# Patient Record
Sex: Female | Born: 1935 | Race: White | Hispanic: No | Marital: Single
Health system: Southern US, Community
[De-identification: ages and names within clinical notes are randomized; demographics above are authoritative.]

## PROBLEM LIST (undated history)

## (undated) DIAGNOSIS — E039 Hypothyroidism, unspecified: Secondary | ICD-10-CM

## (undated) DIAGNOSIS — G250 Essential tremor: Secondary | ICD-10-CM

## (undated) DIAGNOSIS — F3289 Other specified depressive episodes: Secondary | ICD-10-CM

## (undated) DIAGNOSIS — D649 Anemia, unspecified: Secondary | ICD-10-CM

## (undated) DIAGNOSIS — I10 Essential (primary) hypertension: Secondary | ICD-10-CM

## (undated) DIAGNOSIS — I2510 Atherosclerotic heart disease of native coronary artery without angina pectoris: Secondary | ICD-10-CM

## (undated) DIAGNOSIS — K589 Irritable bowel syndrome without diarrhea: Secondary | ICD-10-CM

## (undated) DIAGNOSIS — I509 Heart failure, unspecified: Secondary | ICD-10-CM

## (undated) DIAGNOSIS — F329 Major depressive disorder, single episode, unspecified: Secondary | ICD-10-CM

## (undated) DIAGNOSIS — R609 Edema, unspecified: Secondary | ICD-10-CM

## (undated) DIAGNOSIS — G252 Other specified forms of tremor: Secondary | ICD-10-CM

## (undated) HISTORY — DX: Other specified forms of tremor: G25.2

## (undated) HISTORY — DX: Hypothyroidism, unspecified: E03.9

## (undated) HISTORY — DX: Other specified depressive episodes: F32.89

## (undated) HISTORY — DX: Essential (primary) hypertension: I10

## (undated) HISTORY — DX: Edema, unspecified: R60.9

## (undated) HISTORY — DX: Essential tremor: G25.0

## (undated) HISTORY — DX: Heart failure, unspecified: I50.9

## (undated) HISTORY — DX: Anemia, unspecified: D64.9

## (undated) HISTORY — DX: Atherosclerotic heart disease of native coronary artery without angina pectoris: I25.10

## (undated) HISTORY — DX: Major depressive disorder, single episode, unspecified: F32.9

## (undated) HISTORY — DX: Irritable bowel syndrome, unspecified: K58.9

---

## 1997-08-16 ENCOUNTER — Other Ambulatory Visit: Admission: RE | Admit: 1997-08-16 | Discharge: 1997-08-16 | Payer: Self-pay | Admitting: Internal Medicine

## 1997-12-15 ENCOUNTER — Ambulatory Visit (HOSPITAL_COMMUNITY): Admission: RE | Admit: 1997-12-15 | Discharge: 1997-12-15 | Payer: Self-pay

## 1998-08-22 ENCOUNTER — Other Ambulatory Visit: Admission: RE | Admit: 1998-08-22 | Discharge: 1998-08-22 | Payer: Self-pay | Admitting: Internal Medicine

## 1999-03-12 ENCOUNTER — Encounter: Admission: RE | Admit: 1999-03-12 | Discharge: 1999-03-12 | Payer: Self-pay | Admitting: Internal Medicine

## 1999-03-12 ENCOUNTER — Encounter: Payer: Self-pay | Admitting: Internal Medicine

## 1999-05-09 ENCOUNTER — Ambulatory Visit (HOSPITAL_COMMUNITY): Admission: RE | Admit: 1999-05-09 | Discharge: 1999-05-09 | Payer: Self-pay | Admitting: *Deleted

## 1999-08-23 ENCOUNTER — Other Ambulatory Visit: Admission: RE | Admit: 1999-08-23 | Discharge: 1999-08-23 | Payer: Self-pay | Admitting: Internal Medicine

## 1999-10-06 ENCOUNTER — Emergency Department (HOSPITAL_COMMUNITY): Admission: EM | Admit: 1999-10-06 | Discharge: 1999-10-06 | Payer: Self-pay | Admitting: Emergency Medicine

## 1999-11-13 ENCOUNTER — Encounter: Admission: RE | Admit: 1999-11-13 | Discharge: 1999-11-13 | Payer: Self-pay | Admitting: Internal Medicine

## 1999-11-13 ENCOUNTER — Encounter: Payer: Self-pay | Admitting: Internal Medicine

## 2000-07-16 ENCOUNTER — Ambulatory Visit (HOSPITAL_COMMUNITY): Admission: RE | Admit: 2000-07-16 | Discharge: 2000-07-16 | Payer: Self-pay | Admitting: *Deleted

## 2000-09-09 ENCOUNTER — Other Ambulatory Visit: Admission: RE | Admit: 2000-09-09 | Discharge: 2000-09-09 | Payer: Self-pay | Admitting: Internal Medicine

## 2000-09-30 ENCOUNTER — Ambulatory Visit (HOSPITAL_COMMUNITY): Admission: RE | Admit: 2000-09-30 | Discharge: 2000-09-30 | Payer: Self-pay | Admitting: Cardiovascular Disease

## 2000-10-30 ENCOUNTER — Encounter: Admission: RE | Admit: 2000-10-30 | Discharge: 2000-10-30 | Payer: Self-pay | Admitting: Internal Medicine

## 2000-10-30 ENCOUNTER — Encounter: Payer: Self-pay | Admitting: Internal Medicine

## 2000-11-21 ENCOUNTER — Encounter: Payer: Self-pay | Admitting: Internal Medicine

## 2000-11-21 ENCOUNTER — Encounter: Admission: RE | Admit: 2000-11-21 | Discharge: 2000-11-21 | Payer: Self-pay | Admitting: Internal Medicine

## 2001-01-15 ENCOUNTER — Encounter: Payer: Self-pay | Admitting: Internal Medicine

## 2001-01-15 ENCOUNTER — Encounter: Admission: RE | Admit: 2001-01-15 | Discharge: 2001-01-15 | Payer: Self-pay | Admitting: Internal Medicine

## 2001-11-10 ENCOUNTER — Encounter: Payer: Self-pay | Admitting: Orthopedic Surgery

## 2001-11-10 ENCOUNTER — Encounter: Admission: RE | Admit: 2001-11-10 | Discharge: 2001-11-10 | Payer: Self-pay | Admitting: Orthopedic Surgery

## 2001-11-12 ENCOUNTER — Ambulatory Visit (HOSPITAL_BASED_OUTPATIENT_CLINIC_OR_DEPARTMENT_OTHER): Admission: RE | Admit: 2001-11-12 | Discharge: 2001-11-12 | Payer: Self-pay | Admitting: Orthopedic Surgery

## 2001-12-02 ENCOUNTER — Encounter: Admission: RE | Admit: 2001-12-02 | Discharge: 2001-12-02 | Payer: Self-pay | Admitting: Internal Medicine

## 2001-12-02 ENCOUNTER — Encounter: Payer: Self-pay | Admitting: Internal Medicine

## 2001-12-23 ENCOUNTER — Encounter: Payer: Self-pay | Admitting: Internal Medicine

## 2001-12-23 ENCOUNTER — Ambulatory Visit (HOSPITAL_COMMUNITY): Admission: RE | Admit: 2001-12-23 | Discharge: 2001-12-23 | Payer: Self-pay | Admitting: Internal Medicine

## 2002-01-14 ENCOUNTER — Encounter: Payer: Self-pay | Admitting: Emergency Medicine

## 2002-01-14 ENCOUNTER — Emergency Department (HOSPITAL_COMMUNITY): Admission: EM | Admit: 2002-01-14 | Discharge: 2002-01-14 | Payer: Self-pay | Admitting: Emergency Medicine

## 2002-02-01 ENCOUNTER — Ambulatory Visit (HOSPITAL_COMMUNITY): Admission: RE | Admit: 2002-02-01 | Discharge: 2002-02-01 | Payer: Self-pay | Admitting: Ophthalmology

## 2002-04-13 ENCOUNTER — Ambulatory Visit (HOSPITAL_COMMUNITY): Admission: RE | Admit: 2002-04-13 | Discharge: 2002-04-13 | Payer: Self-pay | Admitting: Gastroenterology

## 2002-09-03 ENCOUNTER — Ambulatory Visit (HOSPITAL_COMMUNITY): Admission: RE | Admit: 2002-09-03 | Discharge: 2002-09-04 | Payer: Self-pay | Admitting: General Surgery

## 2002-09-03 ENCOUNTER — Encounter: Payer: Self-pay | Admitting: General Surgery

## 2002-09-03 ENCOUNTER — Encounter (INDEPENDENT_AMBULATORY_CARE_PROVIDER_SITE_OTHER): Payer: Self-pay | Admitting: Specialist

## 2002-12-07 ENCOUNTER — Encounter: Admission: RE | Admit: 2002-12-07 | Discharge: 2002-12-07 | Payer: Self-pay | Admitting: Internal Medicine

## 2002-12-07 ENCOUNTER — Encounter: Payer: Self-pay | Admitting: Internal Medicine

## 2003-06-06 ENCOUNTER — Ambulatory Visit (HOSPITAL_COMMUNITY): Admission: RE | Admit: 2003-06-06 | Discharge: 2003-06-07 | Payer: Self-pay | Admitting: General Surgery

## 2003-12-08 ENCOUNTER — Encounter: Admission: RE | Admit: 2003-12-08 | Discharge: 2003-12-08 | Payer: Self-pay | Admitting: Internal Medicine

## 2004-04-26 ENCOUNTER — Ambulatory Visit (HOSPITAL_COMMUNITY): Admission: RE | Admit: 2004-04-26 | Discharge: 2004-04-26 | Payer: Self-pay | Admitting: Cardiology

## 2004-12-27 ENCOUNTER — Encounter: Admission: RE | Admit: 2004-12-27 | Discharge: 2004-12-27 | Payer: Self-pay | Admitting: Internal Medicine

## 2005-04-12 ENCOUNTER — Encounter: Admission: RE | Admit: 2005-04-12 | Discharge: 2005-04-12 | Payer: Self-pay | Admitting: Internal Medicine

## 2005-12-31 ENCOUNTER — Encounter: Admission: RE | Admit: 2005-12-31 | Discharge: 2005-12-31 | Payer: Self-pay | Admitting: *Deleted

## 2007-01-05 ENCOUNTER — Encounter: Admission: RE | Admit: 2007-01-05 | Discharge: 2007-01-05 | Payer: Self-pay | Admitting: *Deleted

## 2008-01-27 ENCOUNTER — Encounter: Admission: RE | Admit: 2008-01-27 | Discharge: 2008-01-27 | Payer: Self-pay | Admitting: *Deleted

## 2008-09-09 ENCOUNTER — Encounter: Admission: RE | Admit: 2008-09-09 | Discharge: 2008-09-09 | Payer: Self-pay | Admitting: Internal Medicine

## 2008-09-30 ENCOUNTER — Encounter: Payer: Self-pay | Admitting: Family Medicine

## 2008-09-30 ENCOUNTER — Ambulatory Visit (HOSPITAL_COMMUNITY): Admission: RE | Admit: 2008-09-30 | Discharge: 2008-09-30 | Payer: Self-pay | Admitting: Family Medicine

## 2008-09-30 ENCOUNTER — Ambulatory Visit: Payer: Self-pay | Admitting: *Deleted

## 2009-02-21 ENCOUNTER — Encounter: Admission: RE | Admit: 2009-02-21 | Discharge: 2009-02-21 | Payer: Self-pay | Admitting: Internal Medicine

## 2009-05-23 ENCOUNTER — Encounter: Admission: RE | Admit: 2009-05-23 | Discharge: 2009-05-23 | Payer: Self-pay | Admitting: Internal Medicine

## 2010-03-31 ENCOUNTER — Inpatient Hospital Stay (HOSPITAL_COMMUNITY)
Admission: EM | Admit: 2010-03-31 | Discharge: 2010-04-09 | Disposition: A | Discharge status: Other Acute Inpatient Hospital | Payer: Self-pay | Source: Home / Self Care | Attending: Cardiology | Admitting: Cardiology

## 2010-04-02 ENCOUNTER — Encounter (INDEPENDENT_AMBULATORY_CARE_PROVIDER_SITE_OTHER): Payer: Self-pay | Admitting: Cardiology

## 2010-06-18 LAB — BASIC METABOLIC PANEL
BUN: 24 mg/dL — ABNORMAL HIGH (ref 6–23)
CO2: 25 mEq/L (ref 19–32)
CO2: 32 mEq/L (ref 19–32)
CO2: 33 mEq/L — ABNORMAL HIGH (ref 19–32)
Calcium: 8.4 mg/dL (ref 8.4–10.5)
Calcium: 8.6 mg/dL (ref 8.4–10.5)
Calcium: 9.2 mg/dL (ref 8.4–10.5)
Chloride: 83 mEq/L — ABNORMAL LOW (ref 96–112)
Chloride: 87 mEq/L — ABNORMAL LOW (ref 96–112)
Creatinine, Ser: 0.76 mg/dL (ref 0.4–1.2)
Creatinine, Ser: 0.87 mg/dL (ref 0.4–1.2)
Creatinine, Ser: 1.05 mg/dL (ref 0.4–1.2)
GFR calc Af Amer: >60 mL/min (ref >60)
GFR calc Af Amer: >60 mL/min (ref >60)
GFR calc Af Amer: >60 mL/min (ref >60)
GFR calc Af Amer: >60 mL/min (ref >60)
GFR calc Af Amer: >60 mL/min (ref >60)
GFR calc non Af Amer: 51 mL/min — ABNORMAL LOW (ref >60)
GFR calc non Af Amer: >60 mL/min (ref >60)
GFR calc non Af Amer: >60 mL/min (ref >60)
GFR calc non Af Amer: >60 mL/min (ref >60)
Glucose, Bld: 106 mg/dL — ABNORMAL HIGH (ref 70–99)
Potassium: 4.6 mEq/L (ref 3.5–5.1)
Sodium: 127 mEq/L — ABNORMAL LOW (ref 135–145)
Sodium: 128 mEq/L — ABNORMAL LOW (ref 135–145)
Sodium: 128 mEq/L — ABNORMAL LOW (ref 135–145)
Sodium: 129 mEq/L — ABNORMAL LOW (ref 135–145)
Sodium: 133 mEq/L — ABNORMAL LOW (ref 135–145)

## 2010-06-18 LAB — COMPREHENSIVE METABOLIC PANEL
Albumin: 2.6 g/dL — ABNORMAL LOW (ref 3.5–5.2)
BUN: 19 mg/dL (ref 6–23)
BUN: 24 mg/dL — ABNORMAL HIGH (ref 6–23)
BUN: 28 mg/dL — ABNORMAL HIGH (ref 6–23)
CO2: 32 mEq/L (ref 19–32)
Calcium: 8.8 mg/dL (ref 8.4–10.5)
Calcium: 8.8 mg/dL (ref 8.4–10.5)
Chloride: 86 mEq/L — ABNORMAL LOW (ref 96–112)
Chloride: 90 mEq/L — ABNORMAL LOW (ref 96–112)
Creatinine, Ser: 0.79 mg/dL (ref 0.4–1.2)
Creatinine, Ser: 0.88 mg/dL (ref 0.4–1.2)
Creatinine, Ser: 1.19 mg/dL (ref 0.4–1.2)
GFR calc non Af Amer: 43 mL/min — ABNORMAL LOW (ref >60)
GFR calc non Af Amer: >60 mL/min (ref >60)
Glucose, Bld: 108 mg/dL — ABNORMAL HIGH (ref 70–99)
Glucose, Bld: 109 mg/dL — ABNORMAL HIGH (ref 70–99)
Total Bilirubin: 0.2 mg/dL — ABNORMAL LOW (ref 0.3–1.2)
Total Bilirubin: 0.5 mg/dL (ref 0.3–1.2)
Total Protein: 6.5 g/dL (ref 6.0–8.3)

## 2010-06-18 LAB — HEPARIN LEVEL (UNFRACTIONATED)
Heparin Unfractionated: 0.1 IU/mL — ABNORMAL LOW (ref 0.30–0.70)
Heparin Unfractionated: 0.26 IU/mL — ABNORMAL LOW (ref 0.30–0.70)
Heparin Unfractionated: 0.4 IU/mL (ref 0.30–0.70)
Heparin Unfractionated: <0.1 IU/mL — ABNORMAL LOW (ref 0.30–0.70)
Heparin Unfractionated: <0.1 IU/mL — ABNORMAL LOW (ref 0.30–0.70)
Heparin Unfractionated: <0.1 IU/mL — ABNORMAL LOW (ref 0.30–0.70)

## 2010-06-18 LAB — CBC
HCT: 27.2 % — ABNORMAL LOW (ref 36.0–46.0)
HCT: 27.3 % — ABNORMAL LOW (ref 36.0–46.0)
Hemoglobin: 8.9 g/dL — ABNORMAL LOW (ref 12.0–15.0)
Hemoglobin: 9.2 g/dL — ABNORMAL LOW (ref 12.0–15.0)
Hemoglobin: 9.2 g/dL — ABNORMAL LOW (ref 12.0–15.0)
Hemoglobin: 9.3 g/dL — ABNORMAL LOW (ref 12.0–15.0)
MCH: 29.7 pg (ref 26.0–34.0)
MCH: 29.8 pg (ref 26.0–34.0)
MCH: 29.8 pg (ref 26.0–34.0)
MCH: 30 pg (ref 26.0–34.0)
MCHC: 32.7 g/dL (ref 30.0–36.0)
MCHC: 33.2 g/dL (ref 30.0–36.0)
MCHC: 33.3 g/dL (ref 30.0–36.0)
MCHC: 33.5 g/dL (ref 30.0–36.0)
MCV: 88.9 fL (ref 78.0–100.0)
MCV: 89.2 fL (ref 78.0–100.0)
MCV: 89.8 fL (ref 78.0–100.0)
MCV: 90.3 fL (ref 78.0–100.0)
MCV: 93.1 fL (ref 78.0–100.0)
Platelets: 321 10*3/uL (ref 150–400)
Platelets: 398 10*3/uL (ref 150–400)
Platelets: 408 10*3/uL — ABNORMAL HIGH (ref 150–400)
RBC: 3.04 MIL/uL — ABNORMAL LOW (ref 3.87–5.11)
RBC: 3.05 MIL/uL — ABNORMAL LOW (ref 3.87–5.11)
RBC: 3.06 MIL/uL — ABNORMAL LOW (ref 3.87–5.11)
RBC: 3.1 MIL/uL — ABNORMAL LOW (ref 3.87–5.11)
RBC: 3.49 MIL/uL — ABNORMAL LOW (ref 3.87–5.11)
RDW: 16.4 % — ABNORMAL HIGH (ref 11.5–15.5)
RDW: 16.6 % — ABNORMAL HIGH (ref 11.5–15.5)
RDW: 16.7 % — ABNORMAL HIGH (ref 11.5–15.5)
WBC: 10.8 10*3/uL — ABNORMAL HIGH (ref 4.0–10.5)
WBC: 9.3 10*3/uL (ref 4.0–10.5)
WBC: 9.7 10*3/uL (ref 4.0–10.5)

## 2010-06-18 LAB — BRAIN NATRIURETIC PEPTIDE
Pro B Natriuretic peptide (BNP): 1009 pg/mL — ABNORMAL HIGH (ref 0.0–100.0)
Pro B Natriuretic peptide (BNP): 1065 pg/mL — ABNORMAL HIGH (ref 0.0–100.0)
Pro B Natriuretic peptide (BNP): 1288 pg/mL — ABNORMAL HIGH (ref 0.0–100.0)
Pro B Natriuretic peptide (BNP): 549 pg/mL — ABNORMAL HIGH (ref 0.0–100.0)

## 2010-06-18 LAB — APTT: aPTT: 39 seconds — ABNORMAL HIGH (ref 24–37)

## 2010-06-18 LAB — VITAMIN B12: Vitamin B-12: 1186 pg/mL — ABNORMAL HIGH (ref 211–911)

## 2010-06-18 LAB — BLOOD GAS, ARTERIAL
Acid-Base Excess: 1.9 mmol/L (ref 0.0–2.0)
Drawn by: 312881
FIO2: 0.4 %
O2 Content: 3 L/min
O2 Saturation: 97.7 %
Patient temperature: 98.7

## 2010-06-18 LAB — LIPID PANEL
Cholesterol: 132 mg/dL (ref 0–200)
LDL Cholesterol: 52 mg/dL (ref 0–99)
Total CHOL/HDL Ratio: 1.8 RATIO
Triglycerides: 37 mg/dL (ref <150)
VLDL: 7 mg/dL (ref 0–40)

## 2010-06-18 LAB — URINALYSIS, ROUTINE W REFLEX MICROSCOPIC
Bilirubin Urine: NEGATIVE
Glucose, UA: NEGATIVE mg/dL
Hgb urine dipstick: NEGATIVE
Ketones, ur: NEGATIVE mg/dL
Ketones, ur: NEGATIVE mg/dL
Leukocytes, UA: NEGATIVE
Nitrite: NEGATIVE
Specific Gravity, Urine: 1.01 (ref 1.005–1.030)
Specific Gravity, Urine: 1.013 (ref 1.005–1.030)
Urobilinogen, UA: 0.2 mg/dL (ref 0.0–1.0)

## 2010-06-18 LAB — URINE MICROSCOPIC-ADD ON

## 2010-06-18 LAB — CARDIAC PANEL(CRET KIN+CKTOT+MB+TROPI)
CK, MB: 3 ng/mL (ref 0.3–4.0)
CK, MB: 4.1 ng/mL — ABNORMAL HIGH (ref 0.3–4.0)
CK, MB: 6.6 ng/mL (ref 0.3–4.0)
CK, MB: 8.5 ng/mL (ref 0.3–4.0)
Relative Index: INVALID (ref 0.0–2.5)
Relative Index: INVALID (ref 0.0–2.5)
Total CK: 46 U/L (ref 7–177)
Total CK: 66 U/L (ref 7–177)
Troponin I: 0.46 ng/mL — ABNORMAL HIGH (ref 0.00–0.06)
Troponin I: 0.69 ng/mL (ref 0.00–0.06)

## 2010-06-18 LAB — POCT I-STAT, CHEM 8
Calcium, Ion: 1.03 mmol/L — ABNORMAL LOW (ref 1.12–1.32)
Glucose, Bld: 179 mg/dL — ABNORMAL HIGH (ref 70–99)
HCT: 35 % — ABNORMAL LOW (ref 36.0–46.0)
Hemoglobin: 11.9 g/dL — ABNORMAL LOW (ref 12.0–15.0)
Sodium: 130 mEq/L — ABNORMAL LOW (ref 135–145)

## 2010-06-18 LAB — HEPATIC FUNCTION PANEL
ALT: 70 U/L — ABNORMAL HIGH (ref 0–35)
Bilirubin, Direct: 0.1 mg/dL (ref 0.0–0.3)
Indirect Bilirubin: 0.5 mg/dL (ref 0.3–0.9)
Total Bilirubin: 0.6 mg/dL (ref 0.3–1.2)

## 2010-06-18 LAB — MAGNESIUM
Magnesium: 1.9 mg/dL (ref 1.5–2.5)
Magnesium: 1.9 mg/dL (ref 1.5–2.5)
Magnesium: 2.3 mg/dL (ref 1.5–2.5)

## 2010-06-18 LAB — URINE CULTURE: Culture  Setup Time: 201112250037

## 2010-06-18 LAB — POCT CARDIAC MARKERS
CKMB, poc: 14.6 ng/mL (ref 1.0–8.0)
Myoglobin, poc: 254 ng/mL (ref 12–200)
Troponin i, poc: 0.78 ng/mL (ref 0.00–0.09)

## 2010-06-18 LAB — DIFFERENTIAL
Lymphocytes Relative: 27 % (ref 12–46)
Lymphs Abs: 4.8 10*3/uL — ABNORMAL HIGH (ref 0.7–4.0)
Monocytes Relative: 7 % (ref 3–12)
Neutro Abs: 11.4 10*3/uL — ABNORMAL HIGH (ref 1.7–7.7)

## 2010-06-18 LAB — IRON AND TIBC
Saturation Ratios: 7 % — ABNORMAL LOW (ref 20–55)
TIBC: 246 ug/dL — ABNORMAL LOW (ref 250–470)

## 2010-06-18 LAB — TROPONIN I: Troponin I: 1.38 ng/mL (ref 0.00–0.06)

## 2010-06-18 LAB — CK TOTAL AND CKMB (NOT AT ARMC)
CK, MB: 16.6 ng/mL (ref 0.3–4.0)
Relative Index: 14.2 — ABNORMAL HIGH (ref 0.0–2.5)

## 2010-06-18 LAB — MRSA PCR SCREENING: MRSA by PCR: NEGATIVE

## 2010-07-19 ENCOUNTER — Encounter (INDEPENDENT_AMBULATORY_CARE_PROVIDER_SITE_OTHER): Payer: Medicare Other | Admitting: Cardiothoracic Surgery

## 2010-07-19 DIAGNOSIS — T85698A Other mechanical complication of other specified internal prosthetic devices, implants and grafts, initial encounter: Secondary | ICD-10-CM

## 2010-07-20 NOTE — Assessment & Plan Note (Signed)
OFFICE VISIT  [name], Tammy DOB:  [birth year]                                        July 19, 2010 CHART #:  [id number]  The patient comes to the office referred by [facility], Dr. Murray Hodgkins.  She had undergone coronary artery bypass grafting in 1999.  She is now 77 years old, has both psychologic and dementia issues and has been living at [name] who had noted a protruding sternal wire and referred her for evaluation.  She comes in today with transportation aide and her knees.  She is confused not sure where she is.  On exam, her blood pressure is 117/73, pulse 71, respiratory rate 16, and O2 saturations 97%.  Her sternum is stable and well healed.  The lower most sternal wire has a chronically appearing site where the end of the twisted wire is poking through the skin as it had gone from a laying down position on the bone to sticking directly out and had eroded through the skin.  In reviewing her chest x-ray, the wire appears intact.  The patient's healthcare power of attorney, niece, was present and we discussed options including hospital outpatient removal or removal in the office.  Neither the patient nor her niece were interested in having being coming to the hospital so she was agreeable with attempting to remove it in the office and if that was not possible then remove it in the in-hospital setting,  The skin around the protruding wire was cleaned with Betadine.  Lidocaine 1% was infiltrated.  The wire was untwisted, easily cut, and pulled out without discomfort to the patient.  A Steri-Strip was placed over the area. Local wound care with instructions were given.  The patient was returned to [name].  The niece notes that she will monitor.  If the patient has any wound issues, she will call office back immediately.  Sheliah Plane, MD Electronically Signed  EG/MEDQ  D:  07/19/2010  T:  07/20/2010  Job:  [id number]  cc:    Lenon Curt. Chilton Si, M.D.

## 2010-08-24 NOTE — H&P (Signed)
NAMEJESSEKA, [name]                         ACCOUNT NO.:  [id number]   MEDICAL RECORD NO.:  [id number]                   PATIENT TYPE:  OIB   LOCATION:  NA                                   FACILITY:  [facility]   PHYSICIAN:  Adolph Pollack, M.D.            DATE OF BIRTH:  [birth year]   DATE OF ADMISSION:  DATE OF DISCHARGE:                                HISTORY & PHYSICAL   REASON FOR ADMISSION:  Elective right inguinal hernia repair.   HISTORY OF PRESENT ILLNESS:  Ms. [name] is an 77 year old female who has  been having some discomfort in the right groin area.  I performed a  laparoscopic cholecystectomy on her in 2004, and laparoscopically saw what  appeared to be a direct inguinal hernia defect, which I informed her of.  It  started to become symptomatic, and now she presents for elective repair.   PAST MEDICAL HISTORY:  1. Hypertension.  2. Coronary artery disease.  3. Bell's palsy.  4. Irritable bowel syndrome.  5. Hiatal hernia with reflux.  6. Peptic ulcer disease.  7. Osteoarthritis.  8. Macular degeneration.   PREVIOUS OPERATIONS:  1. Coronary artery bypass grafting.  2. Bilateral cataract extraction.  3. Laparoscopic cholecystectomy.  4. Right knee arthroscopy.  5. Blood removed from the left eye.  6. Left ear surgery.  7. Tonsillectomy.   ALLERGIES:  1. ERYTHROMYCIN.  2. BACTRIM.  3. PHENERGAN.  4. BIAXIN.  5. ZOLOFT.   SOCIAL HISTORY:  There is no tobacco or alcohol use.   REVIEW OF SYSTEMS:  She lives at a friend's home.  Please the nursing  assessment for her full review of systems.   PHYSICAL EXAMINATION:  GENERAL:  An elderly female in no acute distress.  Very pleasant and cooperative.  NECK:  Supple, without palpable masses or thyroid enlargement.  CHEST:  Well-healed mid-sternal scar.  CARDIOVASCULAR:  Heart demonstrates a regular rate and rhythm.  RESPIRATORY:  Breath sounds equal and clear.  Respirations unlabored.  ABDOMEN:  Soft,  slightly protuberant.  Small upper abdominal scar was  present.  When she stands up, there is a bulge in the right inguinal area.   IMPRESSION:  Increasingly symptomatic right inguinal hernia.   PLAN:  Right inguinal hernia with mesh.  We went over the procedure and the  risks at length preoperatively.  The plan will be to keep her overnight in  the bed.                                                Adolph Pollack, M.D.    [name]  D:  06/06/2003  T:  06/06/2003  Job:  [id number]

## 2010-08-24 NOTE — Cardiovascular Report (Signed)
[place]  Patient:    [name], [name]                      MRN: [id number] Proc. Date: 09/30/00 Adm. Date:  [id number] Disc. Date: [id number] Attending:  Berry, Jonathan [place] CC:         Cardiac Catheterization Laboratory  Raymond C. Eloise Harman., M.D., [facility] R. Tysinger, M.D., [facility]   Cardiac Catheterization  INDICATIONS:  The patient is an 77 year old, white female, now eight years status post coronary artery bypass grafting x2 with a LIMA to her LAD with a graft to the diagonal branch.  She had a Cardiolite stress test performed recently that showed infralateral ischemia which was a new finding compared to her previous Cardiolite performed May 11, 1998.  She had several episodes of chest pain.  She presents now for diagnostic coronary arteriography.  DESCRIPTION OF PROCEDURE:  The patient was brought to the second floor [facility] in the postabsorptive state.  She was premedication with p.o. Valium.  Her right groin was prepped and shaved in the usual sterile fashion.  Xylocaine 1% was used for local anesthesia.  A 6 French sheath was inserted into the right femoral artery using standard Seldinger technique.  The 6 French right and left Judkins diagnostic catheter along with a 6 French pigtail catheter were used for selective coronary angiography, left ventriculography, selective vein graft angiography, internal mammary artery angiography and distal abdominal aortography.  Omnipaque dye was used for the entirety of the case.  Retrograde, aortic, left ventricular, and pullback pressures were recorded.  HEMODYNAMICS: 1. Aortic systolic pressure 179, diastolic pressure 78. 2. Left ventricular systolic pressure 179 and diastolic pressure 24.  SELECTIVE CORONARY ANGIOGRAPHY: 1. Left main:  Normal. 2. Left anterior descending:  The entire proximal LAD was heavily  calcified    fluoroscopically.  There were tandem 70, 90, and 99% stenoses with    trickle component of flow. 3. Left circumflex:  This vessel was free of significant disease. 4. Right coronary artery:  This is a large dominant vessel that was free of    significant disease. 5. Vein graft to the diagonal branch:  Widely patent with excellent retrograde    filling of the LAD and obvious competitive flow from the IMA. 6. Left internal mammary artery:  Widely patent filling the distal    LAD.  LEFT VENTRICULOGRAPHY:  The RAO left ventriculogram was performed using 25 cc of Omnipaque dye at 12 cc/sec.  The overall LVEF was estimated greater than 60% without focal wall motion abnormalities.  DISTAL ABDOMINAL AORTOGRAPHY:  Distal abdominal aortogram was performed using 20 cc of Omnipaque dye at 20 cc/sec.  The renal arteries are widely patent. The infrarenal abdominal aorta and iliac bifurcation appear free of significant atherosclerotic changes.  IMPRESSION:  The patient has widely patent grafts without any angiographic evidence of jeopardized myocardium.  I am unsure of the etiology of her chest pain and/or her Cardiolite abnormalities in the inferolateral distribution. Continued medical therapy will be recommended.  The sheaths were removed and pressure was held on the groin to achieve hemostasis.  The patient left the lab in stable condition.  She will be discharged home today as an outpatient and will follow up with Dr. Charolette Child.  The patient left the lab in stable condition. DD:  09/30/00 TD:  09/30/00 Job: [id number]

## 2010-08-24 NOTE — Procedures (Signed)
[facility]  Patient:    [name], [name]                      MRN: [id number] Proc. Date: 07/16/00 Adm. Date:  [id number] Attending:  Sabino Gasser                           Procedure Report  PROCEDURE PERFORMED:  Colonoscopy.  ENDOSCOPIST:  Sabino Gasser, M.D.  INDICATIONS FOR PROCEDURE:  Rectal bleeding.  ANESTHESIA:  Demerol 60 mg, Versed 6 mg.  DESCRIPTION OF PROCEDURE:  With the patient mildly sedated in the left lateral decubitus position, the Olympus videoscopic colonoscope was inserted in the rectum and passed under direct vision into the cecum through a tortuous sigmoid colon.  The cecum was identified by the ileocecal valve and appendiceal orifice, both of which were photographed.  From this point, the colonoscope was slowly withdrawn, taking circumferential views of the entire colonic mucosa stopping to photograph diverticula seen along the way in the sigmoid colon until we reached the rectum, which appeared normal on direct view and showed hemorrhoids on retroflex view.  The endoscope was straightened and withdrawn.  Patients vital signs and pulse oximeter remained stable.  The patient tolerated the procedure well and without apparent complications.  FINDINGS:  Diverticulosis of the sigmoid colon, internal hemorrhoids. Otherwise unremarkable examination.  PLAN:  Repeat examination in five years if appropriate. DD:  07/16/00 TD:  07/16/00 Job: [id number]
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
## 2010-08-24 NOTE — Op Note (Signed)
[name], [name]                         ACCOUNT NO.:  [id number]   MEDICAL RECORD NO.:  [id number]                   PATIENT TYPE:  OIB   LOCATION:  NA                                   FACILITY:  [facility]   PHYSICIAN:  Adolph Pollack, M.D.            DATE OF BIRTH:  29-Oct-1917   DATE OF PROCEDURE:  06/06/2003  DATE OF DISCHARGE:                                 OPERATIVE REPORT   PREOPERATIVE DIAGNOSIS:  Right inguinal hernia.   POSTOPERATIVE DIAGNOSIS:  Direct right inguinal hernia.   PROCEDURE:  Repair of direct inguinal hernia with polypropylene mesh.   ANESTHESIA:  Local (1% lidocaine with epinephrine plus 0.5% Marcaine plain  plus sodium bicarbonate) MAC.   INDICATIONS FOR PROCEDURE:  [name] is an 77 year old female with  discomfort, found to have an inguinal hernia which is becoming increasingly  symptomatic. She now presents for repair.   DESCRIPTION OF PROCEDURE:  She was seen in the holding area, was taken to  the operating room, placed supine on the operating table. The right groin  was marked with my initials. She was given IV sedation. The right groin was  sterilely prepped and draped.  Local anesthesia was infiltrated in the right  groin region, both superficially and deep. Then incision was made in the  right groin through the skin and subcutaneous tissue until external oblique  aponeurosis was encountered.  More local anesthetic was infiltrated deep to  this, and incision was made through the external oblique aponeurosis toward  the pubic tubercle medially, out toward the anterosuperior iliac spine  laterally.  I then used blunt dissection to identify the underlying internal oblique  aponeurosis and the shelving edge of the inguinal ligament.   There was obvious direct hernia with a fairly large defect present.  Some  extraperitoneal fat was in the hernia. This was reduced.  I identified the  round ligament, skeletonized it, then I ligated it both  medially and  laterally and excised it.  I subsequently loosely approximated some of the  attenuated transversalis fascia together with a running 2-0 Vicryl suture to  keep some of the extraperitoneal fat from coming into the field.  Next, I  brought a piece of 3 x 6 inch polypropylene mesh into the field and anchored  it 1 cm medial to the pubic tubercle with a 2-0 Prolene suture. The inferior  aspect of the mesh was then anchored to the shelving edge of the inguinal  ligament with a running 2-0 Prolene suture.  The superior aspect of the mesh  was then anchored to the internal oblique muscle and aponeurosis with  interrupted 2-0 Vicryl sutures. This provided for more than adequate  coverage of the defect.  The lateral aspect of the mesh was tucked deep to  the external oblique aponeurosis.  Hemostasis was adequate at this time.  I  then anesthetized the ilioinguinal nerve with local anesthetic.  I closed the external oblique aponeurosis over the mesh with a running 3-0  Vicryl suture. Scarpa's fascia was closed with a running 2-0 Vicryl suture.  Skin incision was closed with 4-0 Monocryl subcuticular stitch.  Steri-  Strips and sterile dressings were applied.  The patient tolerated the  procedure well without any apparent complications and was taken to the  recovery room in satisfactory condition.                                               Adolph Pollack, M.D.    Kari Baars  D:  06/06/2003  T:  06/06/2003  Job:  [id number]   cc:   Janae Bridgeman. Eloise Harman., M.D.  [address]  [place]  [place] [address]  Fax: [phone]   Aram Candela. Aleen Campi, M.D.  [address]  [place]  [place] [address]  Fax: [phone]

## 2010-08-24 NOTE — Op Note (Signed)
[name], [name]                         ACCOUNT NO.:  [id number]   MEDICAL RECORD NO.:  [id number]                   PATIENT TYPE:   LOCATION:                                       FACILITY:   PHYSICIAN:  Nadara Mustard, M.D.                DATE OF BIRTH:   DATE OF PROCEDURE:  11/12/2001  DATE OF DISCHARGE:                                 OPERATIVE REPORT   PREOPERATIVE DIAGNOSIS:  Internal derangement, right knee.   POSTOPERATIVE DIAGNOSES:  1. Osteochondral defect of the patella, trochlea, medial femoral condyle and     medial tibial plateau.  2. Bucket-handle lateral meniscal tear.   PROCEDURES:  1. Right knee arthroscopy.  2. Debridement of patella, trochlea, medial femoral condyle and medial     tibial plateau.  3. Partial lateral meniscectomy.   SURGEON:  Nadara Mustard, M.D.   ANESTHESIA:  Knee block.   ESTIMATED BLOOD LOSS:  Minimal.   TOURNIQUET TIME:  None.   DISPOSITION:  To PACU in stable condition.   INDICATIONS FOR PROCEDURE:  The patient is an 77 year old woman who has had  mechanical catching and locking symptoms in her right knee.  She has failed  conservative care.  She has undergone treatment with anti-inflammatories,  injections and activity modifications without relief and presented at this  time for arthroscopic intervention.  The risks and benefits were discussed  including infection, neurovascular injury, persistent pain and need for  additional surgery.  The patient's states she understands and wishes to  proceed at this time.   DESCRIPTION OF PROCEDURE:  The patient was brought to OR room 5 after  undergoing a knee block.  After adequate level of the anesthesia was  obtained, the patient's right lower extremity was prepped using DuraPrep and  draped into a sterile field.  The scope was inserted through the  inferolateral portal and a working portal was established inferomedially.  Visualization of the patellofemoral joint showed  essentially eburnated bone  on the patella and trochlea with cartilage erosion.  Visualization of the  medial femoral condyle again showed grade III to grade IV osteochondral  changes and an intact medial meniscus which was probed and intact.  There  were some loose bodies in the medial joint line and these were debrided as  well as.  Abrasion chondroplasty was performed over the medial femoral  condyle and medial tibial plateau.  On examination of the notch, the ACL was  intact.  Examination of the lateral joint line, in the figure-of-four  position, showed her to have a large bucket-handle tear.  This was debrided  with the basket grabbers and the soft tissue was smooth and hemostasis  obtained with the VapR Mitek.  There were no osteochondral changes of the  lateral femoral condyle or lateral tibial plateau.   Visualization was then performed of all remaining joints with the  patellofemoral joint showing no loose  bodies, lateral gutter with no loose  bodies, medial and lateral joint lines with no loose bodies.  The  instruments were removed and the portals were closed using 3-0 nylon.  The  wounds were covered with Adaptic orthopedic sponges, sterile Webril and an  Ace wrap from toes to thigh.  The patient was then taken to the PACU in  stable condition.  Plan for discharge to home.  Weightbearing as tolerated  with crutches.  Ice pack.  The patient has a prescription for pain  medication.  Will plan to followup in the office in two weeks.                                               Nadara Mustard, M.D.    MVD/MEDQ  D:  11/12/2001  T:  11/16/2001  Job:  [id number]

## 2010-08-24 NOTE — Op Note (Signed)
[name], [name]                         ACCOUNT NO.:  [id number]   MEDICAL RECORD NO.:  [id number]                   PATIENT TYPE:  OIB   LOCATION:  5705                                 FACILITY:  [facility]   PHYSICIAN:  [name], M.D.            DATE OF BIRTH:  06-27-1917   DATE OF PROCEDURE:  09/03/2002  DATE OF DISCHARGE:                                 OPERATIVE REPORT   PREOPERATIVE DIAGNOSIS:  Biliary dyskinesia.   POSTOPERATIVE DIAGNOSIS:  Biliary dyskinesia with chronic cholecystitis and  a right inguinal hernia.   PROCEDURE:  Laparoscopic cholecystectomy and intraoperative cholangiograms.   SURGEON:  [name], M.D.   ASSISTANT:  [name]. [name], M.D.   ANESTHESIA:  General.   INDICATIONS:  The patient is an 77 year old female who has been having right  upper quadrant and epigastric pain with nausea.  She has known hiatal hernia  with reflux, and these symptoms are well controlled with proton pump  inhibitor.  An ultrasound did not demonstrate gallstones.  Nuclear medicine  hepatobiliary scan demonstrated borderline gallbladder function.  She has  elected to have cholecystectomy.  Also of note, she notices and on exam has  a small little bulge in the right lower quadrant/right inguinal area  questionable for a hernia.   DESCRIPTION OF PROCEDURE:  She was seen in the holding area and brought to  the operating room, placed supine on the operating table.  A general  anesthetic was administered.  Her abdominal wall was sterilely prepped and  draped.  Dilute Marcaine solution was infiltrated in the subumbilical  region, and a small subumbilical incision was made through the skin and  subcutaneous tissue until the midline fascia was identified, and a small  incision was made in the fascia.  The peritoneum was then entered sharply  and under direct vision.  A pursestring suture of 0 Vicryl was placed around  the fascial edges.  The Hasson trocar was  introduced into the peritoneal  cavity, and pneumoperitoneum was created with installation of CO2 gas.   Next, the laparoscope was introduced, and I examined the right inguinal  area, and indeed she had what appeared to be a small direct right inguinal  hernia.  I then placed her in the reverse Trendelenburg position.  The right  side was slightly tilted up.  This exposed a gallbladder which was pale red  grossly consistent with chronic inflammatory changes.  An 11 mm trocar was  placed through the epigastric incision and two 5 mm trocars placed in the  right mid abdomen.  The fundus of the gallbladder was grasped and omental  adhesions noted were taken down with cautery.  Next, the fundus was  retracted toward the right shoulder and the infundibulum was grasped.  I  immobilized the infundibulum with blunt and sharp dissection.  I then  identified the cystic duct at its junction with the gallbladder and created  a window around it.  Likewise I identified the cystic artery and created the  window around it using blunt dissection.  A clip was placed just above the  gallbladder, cystic duct junction and a small incision made in the cystic  duct.  The cholangiocatheter was passed through the anterior abdominal wall  to the cystic duct, and then cholangiogram was performed.   Under real time fluoroscopy dilute contrast material was injected to the  cystic duct.  She had a moderate to long cystic duct.  Common hepatic, right  lymphatic and common bile ducts all filled promptly, and the contrast  drained promptly from the common bile duct to the duodenum without obvious  evidence of obstruction.  Final report is pending the radiologist's  interpretation.   The cholangiocatheter was removed.  The cystic duct was clipped three times  staying inside and divided sharply.  The cystic artery was then clipped and  divided.  The gallbladder was then dissected free from the liver bed intact  using  electrocautery.  The liver bed was inspected and bleeding points were  controlled with cautery.  The gallbladder fossa was then irrigated and  inspected, and no further bleeding was noted.  No bile leak was noted.  The  irrigation fluid was evacuated.   The gallbladder was then removed through the subumbilical port and the  subumbilical fascia and the duct were closed under laparoscopic vision by  tightening up and tying down the pursestring suture.  The remaining trocars  were then removed, and a pneumoperitoneum released.  The skin incisions were  closed with 4-0 Monocryl subcuticular stitches.  Steri-Strips and sterile  dressings were applied.   She tolerated the procedure well without any apparent complications, and she  was taken to the recovery room in satisfactory condition.                                               [name], M.D.    [name]  D:  09/03/2002  T:  09/03/2002  Job:  [id number]   cc:   [name], M.D.  [address]  [address]  [place]  [place] [address]  Fax: [phone]   [name]. [name]., M.D.  [address]  [place]  [place] [address]  Fax: [phone]

## 2010-08-24 NOTE — Op Note (Signed)
   NAMEKATARZYNA, WOLVEN                         ACCOUNT NO.:  [id number]   MEDICAL RECORD NO.:  [id number]                   PATIENT TYPE:  AMB   LOCATION:  ENDO                                 FACILITY:  [facility]   PHYSICIAN:  Anselmo Rod, M.D.               DATE OF BIRTH:  08/17/1917   DATE OF PROCEDURE:  04/13/2002  DATE OF DISCHARGE:                                 OPERATIVE REPORT   PROCEDURE PERFORMED:  Esophagogastroduodenoscopy.   ENDOSCOPIST:  Jyothi Nat. Loreta Ave, M.D.   INSTRUMENT COUNT:  Olympus panendoscope   INDICATIONS FOR PROCEDURE:  This 77 year old female with history of  epigastric pain and severe reflux, not responding to proton pump inhibitor.  Rule out ulcer disease.   PREPROCEDURE DIAGNOSIS:  Informed consent was obtained from the patient. The  patient fasted for eight prior to the procedure.   BRIEF HISTORY AND PHYSICAL:  VITAL SIGNS:The patient had stable vital signs.  NECK;  Neck supple.  CHEST:  Chest clear to auscultation.  HEART:  S1,  S2.  ABDOMEN:  Abdomen soft with normal bowel sounds.   DESCRIPTION OF PROCEDURE:  The patient was placed in the left lateral  decubitus position and sedated with 30 mg of Demerol and 4 mg of Versed  intravenously.  Once the patient was adequately sedated and maintained on  low flow oxygen and continuous cardiac monitoring, the Olympus video  panendoscope was advanced through the mouth piece over the tongue into the  esophagus under direct vision.  The entire esophagus appeared normal with no  evidence of  ring, stricture, masses, esophagitis, Barrett's mucosa.  The  vocal cords were visualized appeared normal without any evidence of  erythema, nodularity or ulceration.  There was no evidence of distal  esophagitis.  The entire gastric mucosa and proximal small bowel appeared  normal.  There was hiatal hernia seen on retroflexion. The rest of the  esophagogastroduodenoscopy was normal.   IMPRESSION:  Normal  esophagogastroduodenoscopy except for small hiatal  hernia. Marland Kitchen   RECOMMENDATIONS:  1. Continue PPIs.  2.     Twenty-four hour pH study if refluxive symptoms seem to be troublesome in     spite of  proton pump inhibitor used.  3. Outpatient followup in the next two weeks earlier if need be.                                               Anselmo Rod, M.D.    JNM/MEDQ  D:  04/13/2002  T:  04/13/2002  Job:  [id number]   cc:   Janae Bridgeman. Eloise Harman., M.D.  [address]  [place]  [place] [address]  Fax: [phone]

## 2010-08-24 NOTE — H&P (Signed)
[name], [name]                         ACCOUNT NO.:  [id number]   MEDICAL RECORD NO.:  [id number]                   PATIENT TYPE:  OIB   LOCATION:  2550                                 FACILITY:  [facility]   PHYSICIAN:  Adolph Pollack, M.D.            DATE OF BIRTH:  09-05-1917   DATE OF ADMISSION:  09/03/2002  DATE OF DISCHARGE:                                HISTORY & PHYSICAL   REASON FOR ADMISSION:  Elective laparoscopic cholecystectomy.   HISTORY OF PRESENT ILLNESS:  The patient is an 77 year old female who had  been having right upper quadrant epigastric pains with nausea.  She has a  well known hiatal hernia and reflux disease but these symptoms are well  controlled with Aciphex.  These have been progressively increasing and have  been waking her up in the morning. She does eat her largest meal at night.  She has had an abdominal ultrasound in the past.  There was no evidence of  gallstones or gallbladder wall thickening or biliary dilatation.  Upper  endoscopy was normal except for a small hiatal hernia.  Nuclear medicine,  hepatobiliary scan with CCK injection was done back in September of 2003 and  the gallbladder ejection fraction measured at the lower limits of normal at  32.5%.  She continues to have progressively increasing problems with this.  I had a long discussion with her in the office regarding performing  laparoscopic cholecystectomy for some gallbladder dysfunction.  I reported  that the success rate could be approximately 60 to 80%.  We went over the  procedure and the risks including, but not limited to, bleeding, infection,  common bile duct injury, hepatic injury, bile leak, small intestinal injury,  postprandial diarrhea and cardiopulmonary complications of anesthesia as  well as failure to control her pain.  She understood this and has elected to  proceed with the operation.   PAST MEDICAL HISTORY:  1. Hypertension.  2. Coronary artery  disease.  3. Hiatal hernia with reflux.  4. Arthritis.  5. Hypercholesterolemia.  6. Macular degeneration.  7. Hypothyroidism.  8. Sinus disease.  9. Pneumonia.  10.      Mild anxiety disorder.   PAST SURGICAL HISTORY:  Coronary artery bypass, knee arthroscopy, eye  surgery, and cataract removal.   ALLERGIES:  ERYTHROMYCIN, TRIMETHOBENZAMIDE, PROMETHAZINE, BIAXIN.   MEDICATIONS:  Please see the preoperative nursing assessment sheet.   HABITS:  No tobacco or alcohol use.   SOCIAL HISTORY:  She is retired and single.   PHYSICAL EXAMINATION:  GENERAL APPEARANCE:  An elderly appearing female in  no acute distress, very pleasant and cooperative.  VITAL SIGNS:  HEENT:  Extraocular movements intact.  No icterus.  NECK:  Supple without palpable masses or obvious thyroid enlargement.  CHEST:  There is a well healed midsternal scar.  LUNGS:  Breath sounds are equal and clear.  Respirations are unlabored.  CARDIOVASCULAR:  Heart demonstrates a  regular rate and rhythm.  No murmur  heard.  ABDOMEN:  Soft with mild right upper quadrant tenderness.  No distention.  Just above the right inguinal area, there is a small reducible bulge noted.  Question hernia.  MUSCULOSKELETAL:  There is full range of motion, no edema.   IMPRESSION:  Gallbladder dysfunction/right upper quadrant pain and nausea;  also suggestive of possible right inguinal hernia.   PLAN:  Laparoscopic cholecystectomy as well as laparoscopic evaluation of  right inguinal area.                                               Adolph Pollack, M.D.    Kari Baars  D:  09/03/2002  T:  09/03/2002  Job:  [id number]

## 2011-01-28 LAB — BASIC METABOLIC PANEL
Creatinine: 0.6 mg/dL (ref 0.5–1.1)
Glucose: 82 mg/dL
Potassium: 4.4 mmol/L (ref 3.4–5.3)

## 2011-01-28 LAB — CBC AND DIFFERENTIAL: WBC: 7.3 10^3/mL

## 2011-12-06 IMAGING — CR DG CHEST 1V PORT
1 series · 1 of 1 positions shown · non-contrast
Comparison: 05/23/2009

CLINICAL DATA: Sudden onset of shortness of breath.  History of
CHF.  CABG.  Hypertension.

PORTABLE CHEST - 1 VIEW

[AP]
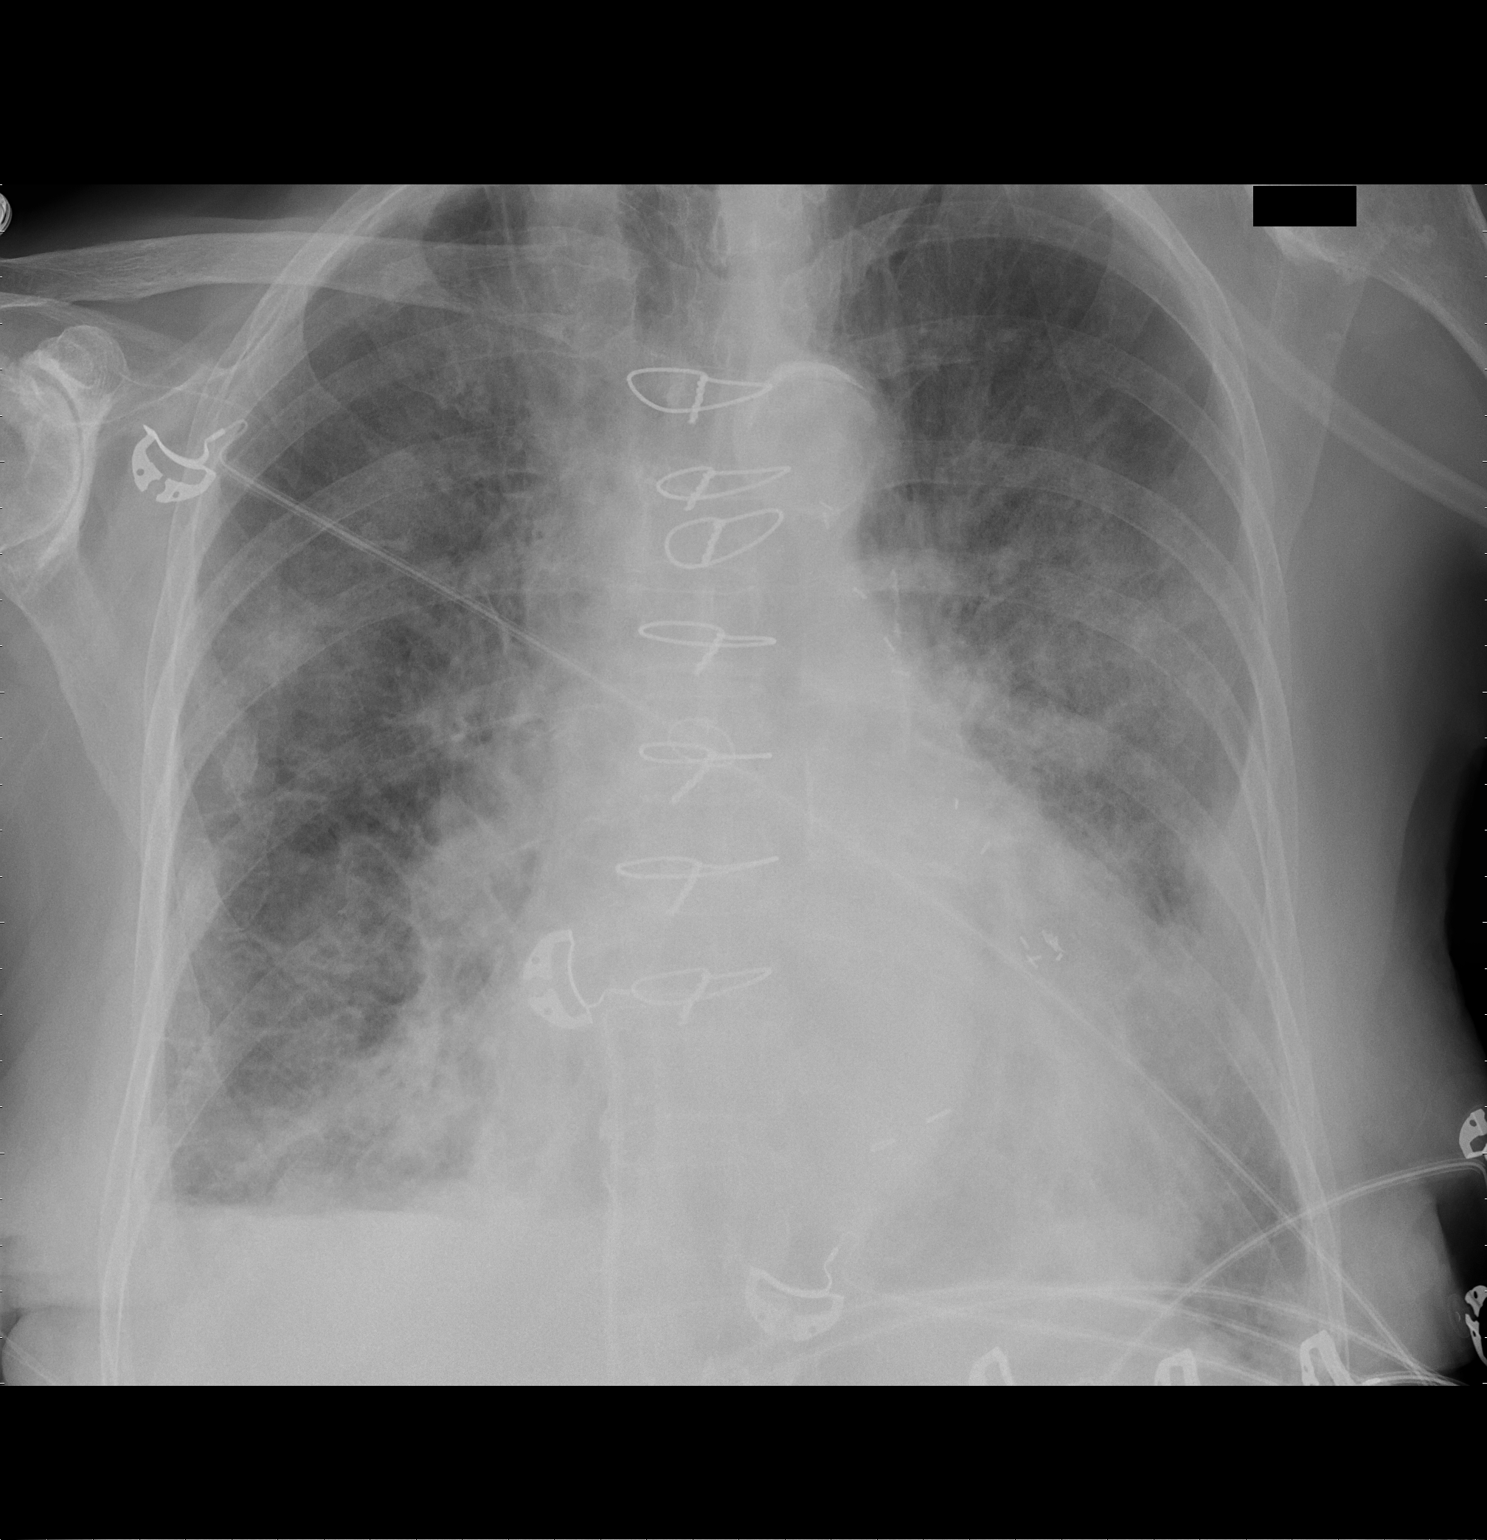

[1 of 1 positions shown; findings below may reference images not displayed]

FINDINGS: There is a moderate cardiac enlargement.  Bilateral
pleural effusions and moderate to marked pulmonary edema is
identified.  Patchy airspace densities are identified bilaterally.
Chronic anterior right rib fractures are again noted.
IMPRESSION: 1.  Moderate to marked congestive heart failure.

## 2011-12-07 IMAGING — CR DG CHEST 1V PORT
1 series · 1 of 1 positions shown · non-contrast
Comparison: A portable chest x-ray of 03/31/2010

CLINICAL DATA: Chest pain, pulmonary edema

PORTABLE CHEST - 1 VIEW

[view not recorded]
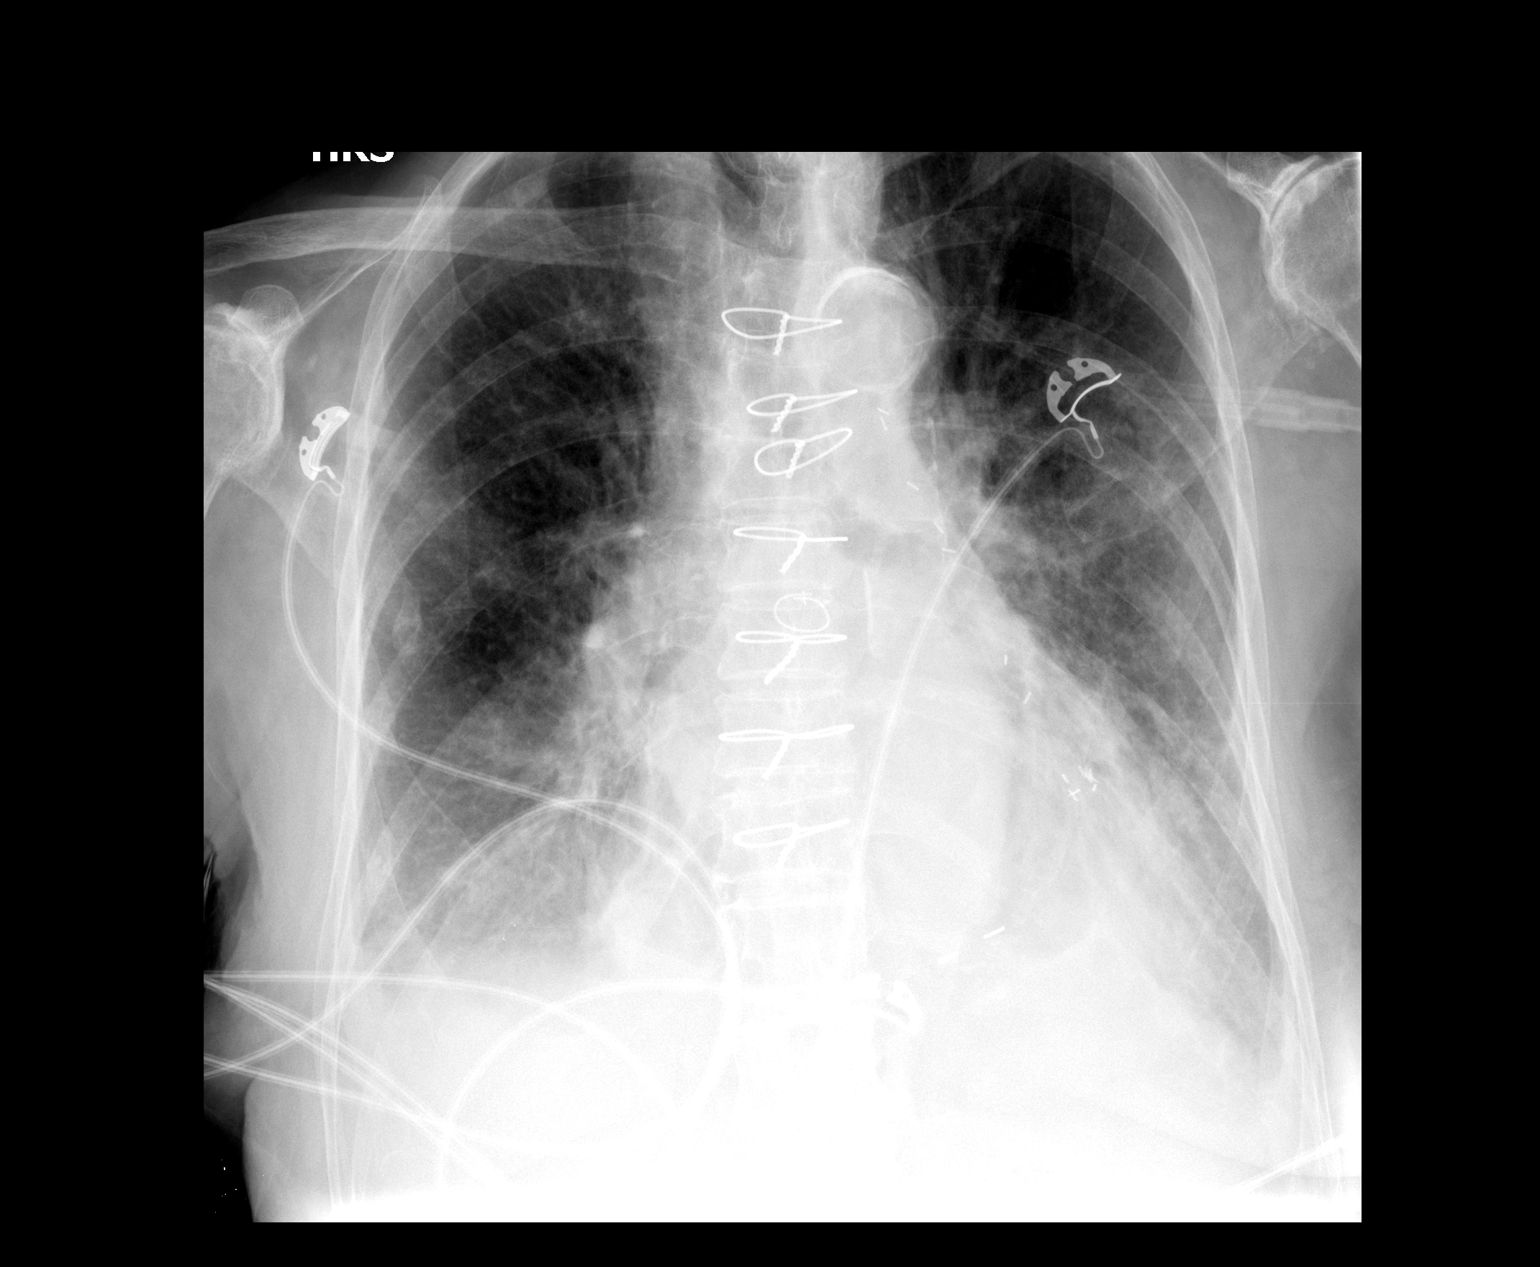

[1 of 1 positions shown; findings below may reference images not displayed]

FINDINGS: There has been improvement in the edema pattern.  There
is cardiomegaly remaining with pulmonary vascular congestion and
small effusions.  Median sternotomy sutures are noted from prior
CABG.
IMPRESSION: Improved edema pattern with mild edema and small effusions
remaining.

## 2011-12-08 IMAGING — CR DG CHEST 1V PORT
1 series · 1 of 1 positions shown · non-contrast
Comparison: 04/01/2010

CLINICAL DATA: Pulmonary edema, CHF, shortness of breath

PORTABLE CHEST - 1 VIEW

[view not recorded]
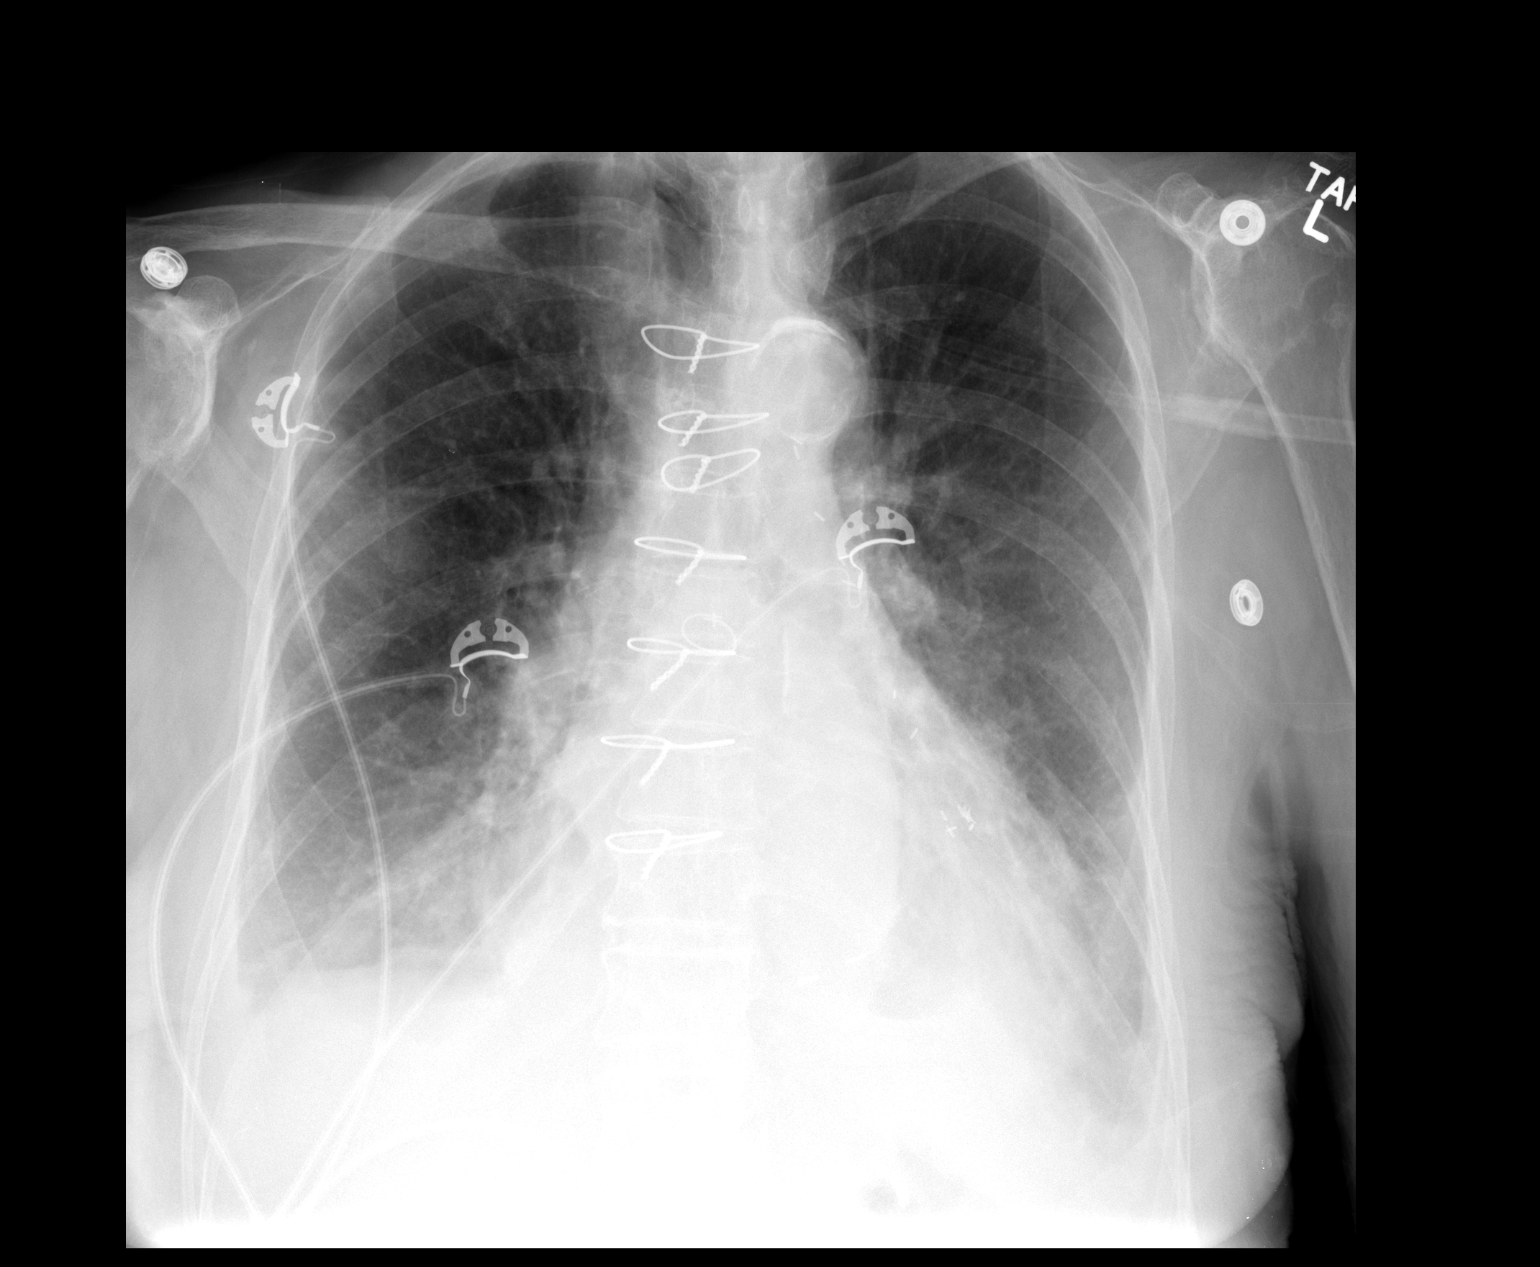

[1 of 1 positions shown; findings below may reference images not displayed]

FINDINGS: Previous coronary bypass changes noted.  Heart remains
enlarged with similar bilateral pleural effusions and basilar
atelectasis/airspace disease.  No significant superimposed edema.
No pneumothorax.  Atherosclerosis of the aorta.
IMPRESSION: Cardiomegaly with residual effusions and basilar atelectasis.

## 2011-12-09 IMAGING — CR DG CHEST 2V
1 series · 1 of 1 positions shown · non-contrast
Comparison: 04/02/2010

CLINICAL DATA: Pulmonary edema.  Respiratory distress.

CHEST - 2 VIEW

[view not recorded]
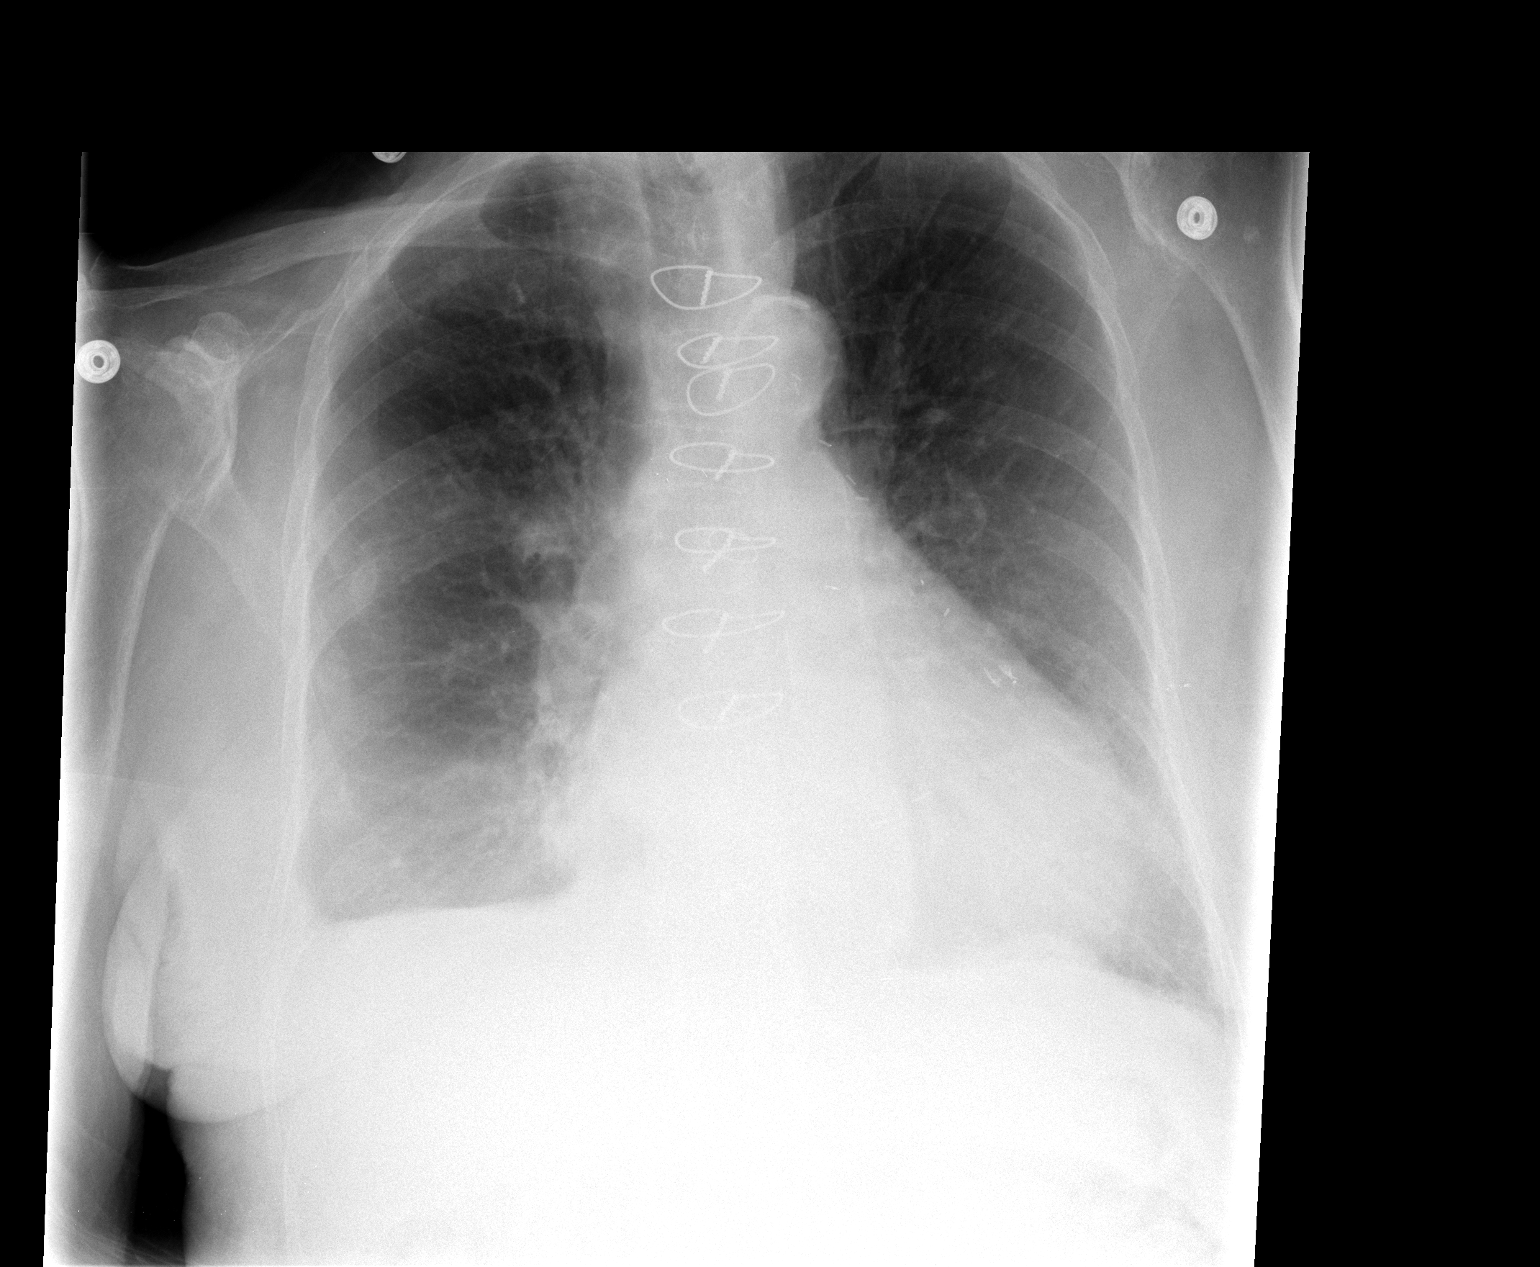

[1 of 1 positions shown; findings below may reference images not displayed]

FINDINGS: AP and lateral views.  Both views degraded by patient
positioning.  The frontal view is AP and the patient is sitting.

Underlying hyperinflation.  Moderate osteopenia.  Probable remote
right rib trauma. Midline trachea.  Mild cardiomegaly. Prior median
sternotomy.  Small bilateral pleural effusions persist. No
pneumothorax.  Improved versus resolved pulmonary venous
congestion.  Improved bibasilar airspace disease.
IMPRESSION: 1.  Overall improved aeration with decreased bibasilar atelectasis
and small bilateral pleural effusions.
2.  Cardiomegaly with improved to resolved pulmonary venous
congestion.

## 2012-07-09 ENCOUNTER — Non-Acute Institutional Stay (SKILLED_NURSING_FACILITY): Payer: Medicare Other | Admitting: Nurse Practitioner

## 2012-07-09 DIAGNOSIS — K589 Irritable bowel syndrome without diarrhea: Secondary | ICD-10-CM

## 2012-07-09 DIAGNOSIS — D649 Anemia, unspecified: Secondary | ICD-10-CM

## 2012-07-09 DIAGNOSIS — G252 Other specified forms of tremor: Secondary | ICD-10-CM

## 2012-07-09 DIAGNOSIS — E039 Hypothyroidism, unspecified: Secondary | ICD-10-CM

## 2012-07-09 DIAGNOSIS — F3289 Other specified depressive episodes: Secondary | ICD-10-CM

## 2012-07-09 DIAGNOSIS — G250 Essential tremor: Secondary | ICD-10-CM

## 2012-07-09 DIAGNOSIS — I2510 Atherosclerotic heart disease of native coronary artery without angina pectoris: Secondary | ICD-10-CM

## 2012-07-09 DIAGNOSIS — R609 Edema, unspecified: Secondary | ICD-10-CM

## 2012-07-09 DIAGNOSIS — F329 Major depressive disorder, single episode, unspecified: Secondary | ICD-10-CM

## 2012-07-09 DIAGNOSIS — I10 Essential (primary) hypertension: Secondary | ICD-10-CM

## 2012-07-09 DIAGNOSIS — I509 Heart failure, unspecified: Secondary | ICD-10-CM

## 2012-07-09 NOTE — Progress Notes (Addendum)
Subjective:    Patient ID: [name], female    DOB: [birth year], 79 y.o.   MRN: [id number]  HPI  HYPOTHYROIDISM The hypothyroidism is stable. TSH 3.547 01/28/11 Synthroid ANEMIA  Hgb 11.6 11/29/10--Hgb 10.8 01/28/11 off Iron DEPRESSIVE DISORDER NEC  f/u Psych Dr.Carr-Psychiatrist. Off Cymbalta 60mg , seems better on Depakote 1000mg /day  Ativan prn ESSENTIAL AND OTHER SPECIFIED FORMS OF TREMOR  stable on Propranolol and Primidone HTN UNSPECIFIED The blood pressure readings taken outside the office since the last visit have been in the target range.on Losartan and Clonidine CAD  s/p bypass in 1994, positive non-ST elevation MI this hospitalization. Comfort measures presently--not taking meds for risk reduction. On NTG--Imdur CONGESTIVE HEART FAILURE  acute on chronic diastolic heart failure, EF 45-50%, improved on Diuretic--Furosemide 60mg  SPASTIC COLON The patient's irritable bowel is stable. Senna EDEMA The edema is stable. on Lasix 60mg  and K daily   Review of Systems  Constitutional: Positive for appetite change (decreased) and fatigue. Negative for fever, diaphoresis and unexpected weight change.  HENT: Positive for hearing loss and trouble swallowing. Negative for congestion, rhinorrhea, neck pain, neck stiffness and sinus pressure. Voice change: warbling.   Eyes: Negative.   Respiratory: Positive for cough (associated with swallowing. ). Negative for choking, chest tightness, shortness of breath and wheezing.   Cardiovascular: Negative for chest pain, palpitations and leg swelling.  Gastrointestinal: Negative for nausea, vomiting, abdominal pain, diarrhea and constipation.  Endocrine: Negative for cold intolerance, heat intolerance, polydipsia, polyphagia and polyuria.       Has been treated for hypothyroidism  Genitourinary: Positive for frequency (incontinent of bladder). Negative for dysuria, urgency, flank pain and pelvic pain.  Musculoskeletal: Positive for back  pain, arthralgias and gait problem (non ambulatory ).  Skin: Negative for rash and wound.  Allergic/Immunologic: Negative.   Neurological: Positive for tremors and speech difficulty (high pitched voice and less verbal than prior). Negative for seizures, weakness, light-headedness, numbness and headaches.  Hematological: Negative.   Psychiatric/Behavioral: Positive for behavioral problems (yelling at times), confusion and agitation. Negative for hallucinations, sleep disturbance and dysphoric mood. The patient is not nervous/anxious.        Objective:   Physical Exam  Constitutional: She appears well-developed and well-nourished.  HENT:  Head: Normocephalic and atraumatic.  Eyes: Conjunctivae and EOM are normal. Pupils are equal, round, and reactive to light.  Neck: Normal range of motion. Neck supple. No JVD present. No thyromegaly present.  Cardiovascular: Normal rate and regular rhythm.   Murmur heard.  Systolic murmur is present with a grade of 2/6  Pulmonary/Chest: Effort normal. She has no wheezes. She has rales (dry rales bibasilar).  Abdominal: Soft. Bowel sounds are normal. There is tenderness.  Musculoskeletal: Normal range of motion. She exhibits no edema and no tenderness.  Lymphadenopathy:    She has no cervical adenopathy.  Neurological: She is alert. She has normal reflexes. She displays normal reflexes. No cranial nerve deficit. She exhibits normal muscle tone. Coordination normal.  Skin: Skin is warm and dry. No rash noted. No erythema.  Psychiatric: Her mood appears not anxious. Her affect is angry, blunt, labile and inappropriate. Her speech is tangential. Her speech is not rapid and/or pressured, not delayed and not slurred. She is agitated. She is not aggressive, not hyperactive, not slowed, not withdrawn, not actively hallucinating and not combative. Thought content is not paranoid and not delusional. Cognition and memory are impaired. She expresses impulsivity and  inappropriate judgment. She exhibits abnormal recent memory.  Lab Reviewed:     01/28/11  BMP Na 137, K 4.4, glucose 82, Bun 24, creatinine 0.61, Ca 8.5  CBC wbc 7.3, Hgb 10.8, Htc 33.4, plt 341  TSH 3.547  Assessment & Plan:    Unspecified hypothyroidism Stable.    Anemia, unspecified Stable.    Depressive disorder, not elsewhere classified Stable.    Essential and other specified forms of tremor Total care, tremor in hands.    Unspecified essential hypertension controlled   Coronary atherosclerosis of native coronary artery S/p CABG, no angina noted   Congestive heart failure, unspecified Compensated on Furosemide   Irritable bowel syndrome No problem.    Edema trace

## 2012-08-14 ENCOUNTER — Encounter: Payer: Self-pay | Admitting: Nurse Practitioner

## 2012-08-14 ENCOUNTER — Non-Acute Institutional Stay (SKILLED_NURSING_FACILITY): Payer: Medicare Other | Admitting: Nurse Practitioner

## 2012-08-14 DIAGNOSIS — I2510 Atherosclerotic heart disease of native coronary artery without angina pectoris: Secondary | ICD-10-CM | POA: Insufficient documentation

## 2012-08-14 DIAGNOSIS — I10 Essential (primary) hypertension: Secondary | ICD-10-CM

## 2012-08-14 DIAGNOSIS — K589 Irritable bowel syndrome without diarrhea: Secondary | ICD-10-CM

## 2012-08-14 DIAGNOSIS — G252 Other specified forms of tremor: Secondary | ICD-10-CM | POA: Insufficient documentation

## 2012-08-14 DIAGNOSIS — G250 Essential tremor: Secondary | ICD-10-CM

## 2012-08-14 DIAGNOSIS — F329 Major depressive disorder, single episode, unspecified: Secondary | ICD-10-CM

## 2012-08-14 DIAGNOSIS — E039 Hypothyroidism, unspecified: Secondary | ICD-10-CM

## 2012-08-14 DIAGNOSIS — R609 Edema, unspecified: Secondary | ICD-10-CM | POA: Insufficient documentation

## 2012-08-14 DIAGNOSIS — F3289 Other specified depressive episodes: Secondary | ICD-10-CM

## 2012-08-14 DIAGNOSIS — D649 Anemia, unspecified: Secondary | ICD-10-CM

## 2012-08-14 DIAGNOSIS — I509 Heart failure, unspecified: Secondary | ICD-10-CM

## 2012-08-14 NOTE — Assessment & Plan Note (Signed)
s/p bypass in 1994, positive non-ST elevation MI this hospitalization. Comfort measures presently--not taking meds for risk reduction. On NTG--Imdur

## 2012-08-14 NOTE — Assessment & Plan Note (Signed)
irritable bowel is stable. Senna

## 2012-08-14 NOTE — Assessment & Plan Note (Signed)
blood pressure readings taken outside the office since the last visit have been in the target range.on Losartan and Clonidine

## 2012-08-14 NOTE — Assessment & Plan Note (Signed)
acute on chronic diastolic heart failure, EF 45-50%, improved on Diuretic--Furosemide 60mg 

## 2012-08-14 NOTE — Assessment & Plan Note (Signed)
Hgb 11.6 11/29/10--Hgb 10.8 01/28/11 off Iron

## 2012-08-14 NOTE — Progress Notes (Signed)
Patient ID: [name], female   DOB: 12/16/1917, 79 y.o.   MRN: [id number]  Chief Complaint:  Chief Complaint  Patient presents with  . Medical Managment of Chronic Issues      Problem List Items Addressed This Visit     ICD-9-CM   Unspecified hypothyroidism - Primary     The hypothyroidism is stable. TSH 3.547 01/28/11 Synthroid    Anemia, unspecified      Hgb 11.6 11/29/10--Hgb 10.8 01/28/11 off Iron    Depressive disorder, not elsewhere classified      f/u Psych Dr.Carr-Psychiatrist. Off Cymbalta 60mg , seems better on Depakote 1000mg /day  Ativan prn     Essential and other specified forms of tremor     stable on Propranolol and Primidone     Unspecified essential hypertension     blood pressure readings taken outside the office since the last visit have been in the target range.on Losartan and Clonidine     Coronary atherosclerosis of native coronary artery     s/p bypass in 1994, positive non-ST elevation MI this hospitalization. Comfort measures presently--not taking meds for risk reduction. On NTG--Imdur    Congestive heart failure, unspecified      acute on chronic diastolic heart failure, EF 45-50%, improved on Diuretic--Furosemide 60mg      Irritable bowel syndrome     irritable bowel is stable. Senna    Edema     The edema is stable. on Lasix 60mg  and K daily        Review of Systems:Review of Systems  Constitutional: Positive for malaise/fatigue. Negative for fever, chills, weight loss and diaphoresis.  HENT: Positive for hearing loss. Negative for congestion, sore throat, neck pain and ear discharge.   Eyes: Negative for pain, discharge and redness.  Respiratory: Negative for cough, sputum production, shortness of breath and wheezing.   Cardiovascular: Negative for chest pain, palpitations, orthopnea, claudication, leg swelling and PND.  Gastrointestinal: Negative for heartburn, nausea, vomiting, abdominal pain, diarrhea, constipation,  blood in stool and melena.  Genitourinary: Positive for frequency (incontinent bladder). Negative for dysuria, urgency, hematuria and flank pain.  Musculoskeletal: Negative for myalgias, back pain, joint pain and falls.  Skin: Negative for itching and rash.  Neurological: Positive for weakness (generalized. ). Negative for dizziness, tingling, tremors, sensory change, speech change, focal weakness, seizures, loss of consciousness and headaches.  Endo/Heme/Allergies: Negative for environmental allergies and polydipsia. Does not bruise/bleed easily.  Psychiatric/Behavioral: Positive for depression and memory loss. Negative for hallucinations. The patient is not nervous/anxious and does not have insomnia.      Medications: Reviewed at [facility]   Physical Examination:  Physical Exam  Constitutional: She appears well-developed and well-nourished.  HENT:  Head: Normocephalic and atraumatic.  Mouth/Throat: No oropharyngeal exudate.  Eyes: Conjunctivae and EOM are normal. Pupils are equal, round, and reactive to light. Right eye exhibits no discharge. Left eye exhibits no discharge. No scleral icterus.  Neck: Normal range of motion. Neck supple. No JVD present. No thyromegaly present.  Cardiovascular: Normal rate and regular rhythm.   Murmur heard.  Systolic murmur is present with a grade of 3/6  Pulmonary/Chest: Effort normal and breath sounds normal. No respiratory distress. She has no wheezes. She has no rales.  Abdominal: Soft. Bowel sounds are normal. She exhibits no distension. There is no tenderness. There is no rebound.  Musculoskeletal: Normal range of motion. She exhibits no edema and no tenderness.  Lymphadenopathy:    She has no cervical adenopathy.  Neurological: She  is alert. She has normal reflexes. She displays normal reflexes. No cranial nerve deficit. She exhibits normal muscle tone. Coordination normal.  Skin: Skin is warm and dry. No rash noted. No erythema.  Psychiatric: Her  mood appears anxious. Her affect is inappropriate. Her affect is not angry. Her speech is not rapid and/or pressured, not delayed, not tangential and not slurred. She is agitated, hyperactive and slowed. She is not aggressive, not withdrawn, not actively hallucinating and not combative. Thought content is delusional. Thought content is not paranoid. Cognition and memory are impaired. She expresses impulsivity and inappropriate judgment. She exhibits a depressed mood. She exhibits abnormal recent memory and abnormal remote memory.       Labs reviewed: Basic Metabolic Panel: No results found for this basename: NA, K, CL, CO2, GLUCOSE, BUN, CREATININE, CALCIUM, MG, PHOS, TSH,  in the last 8760 hours  Liver Function Tests: No results found for this basename: AST, ALT, ALKPHOS, BILITOT, PROT, ALBUMIN,  in the last 8760 hours  CBC: No results found for this basename: WBC, NEUTROABS, HGB, HCT, MCV, PLT,  in the last 8760 hours  Anemia Panel: No results found for this basename: IRON, FOLATE, VITAMINB12,  in the last 8760 hours  Significant Diagnostic Results:     Assessment/Plan Unspecified hypothyroidism The hypothyroidism is stable. TSH 3.547 01/28/11 Synthroid  Anemia, unspecified  Hgb 11.6 11/29/10--Hgb 10.8 01/28/11 off Iron  Depressive disorder, not elsewhere classified  f/u Psych Dr.Carr-Psychiatrist. Off Cymbalta 60mg , seems better on Depakote 1000mg /day  Ativan prn   Essential and other specified forms of tremor stable on Propranolol and Primidone   Unspecified essential hypertension blood pressure readings taken outside the office since the last visit have been in the target range.on Losartan and Clonidine   Coronary atherosclerosis of native coronary artery s/p bypass in 1994, positive non-ST elevation MI this hospitalization. Comfort measures presently--not taking meds for risk reduction. On NTG--Imdur  Congestive heart failure, unspecified  acute on chronic  diastolic heart failure, EF 45-50%, improved on Diuretic--Furosemide 60mg    Irritable bowel syndrome irritable bowel is stable. Senna  Edema The edema is stable. on Lasix 60mg  and K daily       Family/ staff Communication:comfort care   Goals of care: SNF   Labs/tests ordered none

## 2012-08-14 NOTE — Assessment & Plan Note (Signed)
The edema is stable. on Lasix 60mg  and K daily

## 2012-08-14 NOTE — Assessment & Plan Note (Signed)
The hypothyroidism is stable. TSH 3.547 01/28/11 Synthroid 

## 2012-08-14 NOTE — Assessment & Plan Note (Signed)
f/u Psych Dr.Carr-Psychiatrist. Off Cymbalta 60mg , seems better on Depakote 1000mg /day  Ativan prn

## 2012-08-14 NOTE — Assessment & Plan Note (Signed)
stable on Propranolol and Primidone

## 2012-09-04 ENCOUNTER — Non-Acute Institutional Stay (SKILLED_NURSING_FACILITY): Payer: Medicare Other | Admitting: Nurse Practitioner

## 2012-09-04 DIAGNOSIS — F329 Major depressive disorder, single episode, unspecified: Secondary | ICD-10-CM

## 2012-09-04 DIAGNOSIS — G252 Other specified forms of tremor: Secondary | ICD-10-CM

## 2012-09-04 DIAGNOSIS — I509 Heart failure, unspecified: Secondary | ICD-10-CM

## 2012-09-04 DIAGNOSIS — D649 Anemia, unspecified: Secondary | ICD-10-CM

## 2012-09-04 DIAGNOSIS — G250 Essential tremor: Secondary | ICD-10-CM

## 2012-09-04 DIAGNOSIS — R609 Edema, unspecified: Secondary | ICD-10-CM

## 2012-09-04 DIAGNOSIS — I10 Essential (primary) hypertension: Secondary | ICD-10-CM

## 2012-09-04 DIAGNOSIS — E039 Hypothyroidism, unspecified: Secondary | ICD-10-CM

## 2012-09-04 NOTE — Progress Notes (Addendum)
Patient ID: [name], female   DOB: [birth year], 79 y.o.   MRN: [id number]  Chief Complaint:  Chief Complaint  Patient presents with   Medical Managment of Chronic Issues     HPI:    Problem List Items Addressed This Visit   Unspecified hypothyroidism - Primary (Chronic)     Corrected with Levothyroxine.     Anemia, unspecified     Clinical stable.     Depressive disorder, not elsewhere classified     stable    Essential and other specified forms of tremor     Still able to feed herself.     Unspecified essential hypertension     Controlled.     Congestive heart failure, unspecified     Compensated with Lasix.     Edema     Not apparent.        Review of Systems:  Review of Systems  Constitutional: Positive for malaise/fatigue. Negative for fever, chills, weight loss and diaphoresis.  HENT: Positive for hearing loss. Negative for ear pain, congestion, sore throat and neck pain.   Eyes: Negative for blurred vision, double vision, photophobia, pain, discharge and redness.  Respiratory: Negative for cough, sputum production, shortness of breath and wheezing.   Cardiovascular: Negative for chest pain, palpitations, orthopnea, claudication, leg swelling and PND.  Gastrointestinal: Negative for heartburn, nausea, vomiting, abdominal pain, diarrhea, constipation and blood in stool.  Genitourinary: Positive for frequency (incontinent bladder. ). Negative for dysuria, urgency, hematuria and flank pain.  Musculoskeletal: Negative for myalgias, back pain, joint pain and falls.  Skin: Negative for rash.  Neurological: Positive for tremors and weakness (generalized). Negative for dizziness, sensory change, speech change, focal weakness, seizures and loss of consciousness.  Endo/Heme/Allergies: Negative for environmental allergies and polydipsia. Does not bruise/bleed easily.  Psychiatric/Behavioral: Positive for depression and memory loss. Negative for hallucinations. The  patient is not nervous/anxious and does not have insomnia.      Medications: Reviewed at [facility]   Physical Exam: Physical Exam  Constitutional: She appears well-developed and well-nourished.  HENT:  Head: Normocephalic and atraumatic.  Eyes: Conjunctivae and EOM are normal. Pupils are equal, round, and reactive to light.  Neck: Normal range of motion. Neck supple. No JVD present. No thyromegaly present.  Cardiovascular: Normal rate and regular rhythm.   Murmur heard.  Systolic murmur is present with a grade of 2/6  Pulmonary/Chest: Effort normal. She has no wheezes. She has rales (dry rales bibasilar).  Abdominal: Soft. Bowel sounds are normal. There is tenderness.  Musculoskeletal: Normal range of motion. She exhibits no edema and no tenderness.  Lymphadenopathy:    She has no cervical adenopathy.  Neurological: She is alert. She has normal reflexes. No cranial nerve deficit. She exhibits normal muscle tone. Coordination normal.  Skin: Skin is warm and dry. No rash noted. No erythema.  Psychiatric: Her mood appears not anxious. Her affect is angry, blunt, labile and inappropriate. Her speech is tangential. Her speech is not rapid and/or pressured, not delayed and not slurred. She is agitated. She is not aggressive, not hyperactive, not slowed, not withdrawn, not actively hallucinating and not combative. Thought content is not paranoid and not delusional. Cognition and memory are impaired. She expresses impulsivity and inappropriate judgment. She exhibits abnormal recent memory.       Labs reviewed: Basic Metabolic Panel: No results found for this basename: NA, K, CL, CO2, GLUCOSE, BUN, CREATININE, CALCIUM, MG, PHOS, TSH,  in the last 8760 hours  Liver Function Tests: No  results found for this basename: AST, ALT, ALKPHOS, BILITOT, PROT, ALBUMIN,  in the last 8760 hours  CBC: No results found for this basename: WBC, NEUTROABS, HGB, HCT, MCV, PLT,  in the last 8760 hours  Anemia  Panel: No results found for this basename: IRON, FOLATE, VITAMINB12,  in the last 8760 hours  Significant Diagnostic Results:     Assessment/Plan Unspecified hypothyroidism Corrected with Levothyroxine.   Anemia, unspecified Clinical stable.   Depressive disorder, not elsewhere classified stable  Essential and other specified forms of tremor Still able to feed herself.   Unspecified essential hypertension Controlled.   Congestive heart failure, unspecified Compensated with Lasix.   Edema Not apparent.       Family/ staff Communication: observe the patient.    Goals of care: SNF   Labs/tests ordered none.

## 2012-10-16 ENCOUNTER — Non-Acute Institutional Stay (SKILLED_NURSING_FACILITY): Payer: Medicare Other | Admitting: Nurse Practitioner

## 2012-10-16 ENCOUNTER — Encounter: Payer: Self-pay | Admitting: Nurse Practitioner

## 2012-10-16 DIAGNOSIS — F329 Major depressive disorder, single episode, unspecified: Secondary | ICD-10-CM

## 2012-10-16 DIAGNOSIS — E039 Hypothyroidism, unspecified: Secondary | ICD-10-CM

## 2012-10-16 DIAGNOSIS — R609 Edema, unspecified: Secondary | ICD-10-CM

## 2012-10-16 DIAGNOSIS — G250 Essential tremor: Secondary | ICD-10-CM

## 2012-10-16 DIAGNOSIS — G252 Other specified forms of tremor: Secondary | ICD-10-CM

## 2012-10-16 DIAGNOSIS — I10 Essential (primary) hypertension: Secondary | ICD-10-CM

## 2012-10-16 DIAGNOSIS — I2510 Atherosclerotic heart disease of native coronary artery without angina pectoris: Secondary | ICD-10-CM

## 2012-10-16 NOTE — Assessment & Plan Note (Signed)
s/p bypass in 1994, positive non-ST elevation MI this hospitalization. Comfort measures presently--not taking meds for risk reduction. On NTG--Imdur 30mg daily.        

## 2012-10-16 NOTE — Assessment & Plan Note (Signed)
The edema is stable. on Lasix 60mg  and K daily

## 2012-10-16 NOTE — Progress Notes (Signed)
Patient ID: [name], female   DOB: 01-22-1918, 79 y.o.   MRN: [id number] Code Status: DNR  Allergies  Allergen Reactions  . Celebrex (Celecoxib)   . Citalopram   . Clarithromycin   . Dust Mite Extract   . Erythromycin   . Helidac (Metronid-Tetracyc-Bis Subsal)   . Mirapex (Pramipexole Dihydrochloride)   . Mold Extract (Trichophyton)   . Pramipexole   . Promethazine   . Sertraline   . Trimethobenzamide     Chief Complaint  Patient presents with  . Medical Managment of Chronic Issues    HPI: Patient is a 79 y.o. female seen in the SNF  at [facility] today for evaluation of her chronic medical conditions.  Problem List Items Addressed This Visit   Coronary atherosclerosis of native coronary artery     s/p bypass in 1994, positive non-ST elevation MI this hospitalization. Comfort measures presently--not taking meds for risk reduction. On NTG--Imdur 30mg  daily.       Depressive disorder, not elsewhere classified      f/u Psych Dr.Carr-Psychiatrist. Off Cymbalta 60mg , seems better on Depakote 250mg /day         Edema     The edema is stable. on Lasix 60mg  and K daily       Essential and other specified forms of tremor     stable on Propranolol 40mg  tid and Primidone 50mg  qid       Unspecified essential hypertension - Primary     blood pressure readings taken outside the office since the last visit have been in the target range.on Losartan 100mg  qd and Clonidine 0.1mg  qd       Unspecified hypothyroidism     The hypothyroidism is stable. TSH 3.547 01/28/11 Synthroid         Review of Systems:  Review of Systems  Constitutional: Positive for malaise/fatigue. Negative for fever, chills, weight loss and diaphoresis.  HENT: Positive for hearing loss. Negative for congestion, sore throat, neck pain and ear discharge.   Eyes: Negative for pain, discharge and redness.  Respiratory: Negative for cough, sputum production, shortness of breath  and wheezing.   Cardiovascular: Negative for chest pain, palpitations, orthopnea, claudication, leg swelling and PND.  Gastrointestinal: Negative for heartburn, nausea, vomiting, abdominal pain, diarrhea, constipation, blood in stool and melena.  Genitourinary: Positive for frequency (incontinent bladder). Negative for dysuria, urgency, hematuria and flank pain.  Musculoskeletal: Negative for myalgias, back pain, joint pain and falls.  Skin: Negative for itching and rash.  Neurological: Positive for weakness (generalized. ). Negative for dizziness, tingling, tremors, sensory change, speech change, focal weakness, seizures, loss of consciousness and headaches.  Endo/Heme/Allergies: Negative for environmental allergies and polydipsia. Does not bruise/bleed easily.  Psychiatric/Behavioral: Positive for depression and memory loss. Negative for hallucinations. The patient is not nervous/anxious and does not have insomnia.      Past Medical History  Diagnosis Date  . Unspecified hypothyroidism   . Anemia, unspecified   . Depressive disorder, not elsewhere classified   . Essential and other specified forms of tremor   . Unspecified essential hypertension   . Coronary atherosclerosis of native coronary artery   . Congestive heart failure, unspecified   . Irritable bowel syndrome   . Edema    History reviewed. No pertinent past surgical history. Social History:   has no tobacco, alcohol, and drug history on file.  History reviewed. No pertinent family history.  Medications: Reviewed at [facility]   Physical Exam: Physical Exam  Constitutional:  She appears well-developed and well-nourished.  HENT:  Head: Normocephalic and atraumatic.  Mouth/Throat: No oropharyngeal exudate.  Eyes: Conjunctivae and EOM are normal. Pupils are equal, round, and reactive to light. Right eye exhibits no discharge. Left eye exhibits no discharge. No scleral icterus.  Neck: Normal range of motion. Neck supple. No  JVD present. No thyromegaly present.  Cardiovascular: Normal rate and regular rhythm.   Murmur heard.  Systolic murmur is present with a grade of 3/6  Pulmonary/Chest: Effort normal and breath sounds normal. No respiratory distress. She has no wheezes. She has no rales.  Abdominal: Soft. Bowel sounds are normal. She exhibits no distension. There is no tenderness. There is no rebound.  Musculoskeletal: Normal range of motion. She exhibits no edema and no tenderness.  Lymphadenopathy:    She has no cervical adenopathy.  Neurological: She is alert. She has normal reflexes. She displays normal reflexes. No cranial nerve deficit. She exhibits normal muscle tone. Coordination normal.  Skin: Skin is warm and dry. No rash noted. No erythema.  Psychiatric: Her mood appears anxious. Her affect is inappropriate. Her affect is not angry. Her speech is not rapid and/or pressured, not delayed, not tangential and not slurred. She is agitated, hyperactive and slowed. She is not aggressive, not withdrawn, not actively hallucinating and not combative. Thought content is delusional. Thought content is not paranoid. Cognition and memory are impaired. She expresses impulsivity and inappropriate judgment. She exhibits a depressed mood. She exhibits abnormal recent memory and abnormal remote memory.    Filed Vitals:   10/16/12 1457  BP: 134/68  Pulse: 65  Temp: 97.4 F (36.3 C)  TempSrc: Tympanic  Resp: 20       Assessment/Plan Unspecified essential hypertension blood pressure readings taken outside the office since the last visit have been in the target range.on Losartan 100mg  qd and Clonidine 0.1mg  qd     Edema The edema is stable. on Lasix 60mg  and K daily     Coronary atherosclerosis of native coronary artery s/p bypass in 1994, positive non-ST elevation MI this hospitalization. Comfort measures presently--not taking meds for risk reduction. On NTG--Imdur 30mg  daily.     Unspecified  hypothyroidism The hypothyroidism is stable. TSH 3.547 01/28/11 Synthroid    Depressive disorder, not elsewhere classified  f/u Psych Dr.Carr-Psychiatrist. Off Cymbalta 60mg , seems better on Depakote 250mg /day       Essential and other specified forms of tremor stable on Propranolol 40mg  tid and Primidone 50mg  qid       Family/ Staff Communication: observe the patient.   Goals of Care: SNF and Hospice Service.   Labs/tests ordered: none

## 2012-10-16 NOTE — Assessment & Plan Note (Signed)
blood pressure readings taken outside the office since the last visit have been in the target range.on Losartan 100mg qd and Clonidine 0.1mg qd        

## 2012-10-16 NOTE — Assessment & Plan Note (Addendum)
f/u Psych Dr.Carr-Psychiatrist. Off Cymbalta 60mg , seems better on Depakote 250mg /day

## 2012-10-16 NOTE — Assessment & Plan Note (Signed)
stable on Propranolol 40mg  tid and Primidone 50mg  qid

## 2012-10-16 NOTE — Assessment & Plan Note (Signed)
The hypothyroidism is stable. TSH 3.547 01/28/11 Synthroid 175mcg     

## 2012-10-27 ENCOUNTER — Non-Acute Institutional Stay (SKILLED_NURSING_FACILITY): Payer: Medicare Other | Admitting: Nurse Practitioner

## 2012-10-27 ENCOUNTER — Encounter: Payer: Self-pay | Admitting: Nurse Practitioner

## 2012-10-27 DIAGNOSIS — F329 Major depressive disorder, single episode, unspecified: Secondary | ICD-10-CM

## 2012-10-27 DIAGNOSIS — R609 Edema, unspecified: Secondary | ICD-10-CM

## 2012-10-27 DIAGNOSIS — F3289 Other specified depressive episodes: Secondary | ICD-10-CM

## 2012-10-27 DIAGNOSIS — I509 Heart failure, unspecified: Secondary | ICD-10-CM

## 2012-10-27 DIAGNOSIS — I10 Essential (primary) hypertension: Secondary | ICD-10-CM

## 2012-10-27 DIAGNOSIS — G250 Essential tremor: Secondary | ICD-10-CM

## 2012-10-27 DIAGNOSIS — B351 Tinea unguium: Secondary | ICD-10-CM

## 2012-10-27 DIAGNOSIS — E039 Hypothyroidism, unspecified: Secondary | ICD-10-CM

## 2012-10-27 NOTE — Assessment & Plan Note (Signed)
stable on Propranolol 40mg tid and Primidone 50mg qid    

## 2012-10-27 NOTE — Assessment & Plan Note (Signed)
acute on chronic diastolic heart failure, EF 45-50%, improved on Diuretic--Furosemide 60mg , no BLE edema-dc TED

## 2012-10-27 NOTE — Assessment & Plan Note (Signed)
POA desires to treat-[place] nail lacquer 8% sol apply to toe nails qhs for 48 weeks, clean nails with alcohol  q7d.

## 2012-10-27 NOTE — Progress Notes (Signed)
Patient ID: [name], female   DOB: July 13, 1917, 79 y.o.   MRN: [id number]  Location of service: [facility]  Code Status: DNR  Allergies  Allergen Reactions  . Celebrex (Celecoxib)   . Citalopram   . Clarithromycin   . Dust Mite Extract   . Erythromycin   . Helidac (Metronid-Tetracyc-Bis Subsal)   . Mirapex (Pramipexole Dihydrochloride)   . Mold Extract (Trichophyton)   . Pramipexole   . Promethazine   . Sertraline   . Trimethobenzamide     Chief Complaint  Patient presents with  . Medical Managment of Chronic Issues    depression/anxiety, thickened toe nails.     HPI: Patient is a 79 y.o. female seen in the SNF at [facility]  today for yellow, thickened, brittle toe nails, depression, and other chronic medical conditions.  Problem List Items Addressed This Visit   Congestive heart failure, unspecified      acute on chronic diastolic heart failure, EF 45-50%, improved on Diuretic--Furosemide 60mg , no BLE edema-dc TED       Depressive disorder, not elsewhere classified - Primary      saw Psych Dr.Carr-Psychiatrist in the past. Off Cymbalta 60mg , seems better on Depakote 250mg /day--sometimes the patient sleeps through verbal/physical stimuli--will decrease Depakote to 125mg  po daily-monitor the patient.          Edema     No apparent edema in her dependent area- on Lasix 60mg  and K daily         Essential and other specified forms of tremor     stable on Propranolol 40mg  tid and Primidone 50mg  qid         Onychomycosis of toenail     POA desires to treat-[place] nail lacquer 8% sol apply to toe nails qhs for 48 weeks, clean nails with alcohol  q7d.    Unspecified essential hypertension     blood pressure readings taken outside the office since the last visit have been in the target range.on Losartan 100mg  qd and Clonidine 0.1mg  qd         Unspecified hypothyroidism     The hypothyroidism is stable. TSH 3.547 01/28/11 Synthroid            Review of Systems:  Review of Systems  Constitutional: Positive for malaise/fatigue. Negative for fever, chills, weight loss and diaphoresis.  HENT: Positive for hearing loss. Negative for congestion, sore throat, neck pain and ear discharge.   Eyes: Negative for pain, discharge and redness.  Respiratory: Negative for cough, sputum production, shortness of breath and wheezing.   Cardiovascular: Negative for chest pain, palpitations, orthopnea, claudication, leg swelling and PND.  Gastrointestinal: Negative for heartburn, nausea, vomiting, abdominal pain, diarrhea, constipation, blood in stool and melena.  Genitourinary: Positive for frequency (incontinent bladder). Negative for dysuria, urgency, hematuria and flank pain.  Musculoskeletal: Negative for myalgias, back pain, joint pain and falls.  Skin: Negative for itching and rash.       Thickened, yellow, brittle toe nails.   Neurological: Positive for weakness (generalized. ). Negative for dizziness, tingling, tremors, sensory change, speech change, focal weakness, seizures, loss of consciousness and headaches.  Endo/Heme/Allergies: Negative for environmental allergies and polydipsia. Does not bruise/bleed easily.  Psychiatric/Behavioral: Positive for depression and memory loss. Negative for hallucinations. The patient is not nervous/anxious and does not have insomnia.      Past Medical History  Diagnosis Date  . Unspecified hypothyroidism   . Anemia, unspecified   . Depressive disorder, not elsewhere classified   .  Essential and other specified forms of tremor   . Unspecified essential hypertension   . Coronary atherosclerosis of native coronary artery   . Congestive heart failure, unspecified   . Irritable bowel syndrome   . Edema    History reviewed. No pertinent past surgical history. Social History:   has no tobacco, alcohol, and drug history on file.  History reviewed. No pertinent family  history.  Medications: Reviewed at [facility]   Physical Exam: Physical Exam  Constitutional: She appears well-developed and well-nourished.  HENT:  Head: Normocephalic and atraumatic.  Mouth/Throat: No oropharyngeal exudate.  Eyes: Conjunctivae and EOM are normal. Pupils are equal, round, and reactive to light. Right eye exhibits no discharge. Left eye exhibits no discharge. No scleral icterus.  Neck: Normal range of motion. Neck supple. No JVD present. No thyromegaly present.  Cardiovascular: Normal rate and regular rhythm.   Murmur heard.  Systolic murmur is present with a grade of 3/6  Pulmonary/Chest: Effort normal and breath sounds normal. No respiratory distress. She has no wheezes. She has no rales.  Abdominal: Soft. Bowel sounds are normal. She exhibits no distension. There is no tenderness. There is no rebound.  Musculoskeletal: Normal range of motion. She exhibits no edema and no tenderness.  Lymphadenopathy:    She has no cervical adenopathy.  Neurological: She is alert. She has normal reflexes. She displays normal reflexes. No cranial nerve deficit. She exhibits normal muscle tone. Coordination normal.  Skin: Skin is warm and dry. No rash noted. No erythema.  Yellow, thickened, brittle toe nails.   Psychiatric: Her mood appears anxious. Her affect is inappropriate. Her affect is not angry. Her speech is not rapid and/or pressured, not delayed, not tangential and not slurred. She is agitated, hyperactive and slowed. She is not aggressive, not withdrawn, not actively hallucinating and not combative. Thought content is delusional. Thought content is not paranoid. Cognition and memory are impaired. She expresses impulsivity and inappropriate judgment. She exhibits a depressed mood. She exhibits abnormal recent memory and abnormal remote memory.    Filed Vitals:   10/27/12 1201  BP: 140/80  Pulse: 76  Temp: 98.4 F (36.9 C)  TempSrc: Tympanic  Resp: 24      Labs  reviewed: Basic Metabolic Panel: No results found for this basename: NA, K, CL, CO2, GLUCOSE, BUN, CREATININE, CALCIUM, MG, PHOS, TSH,  in the last 8760 hours Liver Function Tests: No results found for this basename: AST, ALT, ALKPHOS, BILITOT, PROT, ALBUMIN,  in the last 8760 hours No results found for this basename: LIPASE, AMYLASE,  in the last 8760 hours No results found for this basename: AMMONIA,  in the last 8760 hours CBC: No results found for this basename: WBC, NEUTROABS, HGB, HCT, MCV, PLT,  in the last 8760 hours Lipid Panel: No results found for this basename: CHOL, HDL, LDLCALC, TRIG, CHOLHDL, LDLDIRECT,  in the last 8760 hours Anemia Panel: No results found for this basename: FOLATE, IRON, VITAMINB12,  in the last 8760 hours      Assessment/Plan Depressive disorder, not elsewhere classified  saw Psych Dr.Carr-Psychiatrist in the past. Off Cymbalta 60mg , seems better on Depakote 250mg /day--sometimes the patient sleeps through verbal/physical stimuli--will decrease Depakote to 125mg  po daily-monitor the patient.        Congestive heart failure, unspecified  acute on chronic diastolic heart failure, EF 45-50%, improved on Diuretic--Furosemide 60mg , no BLE edema-dc TED     Edema No apparent edema in her dependent area- on Lasix 60mg  and K daily  Unspecified essential hypertension blood pressure readings taken outside the office since the last visit have been in the target range.on Losartan 100mg  qd and Clonidine 0.1mg  qd       Essential and other specified forms of tremor stable on Propranolol 40mg  tid and Primidone 50mg  qid       Unspecified hypothyroidism The hypothyroidism is stable. TSH 3.547 01/28/11 Synthroid      Onychomycosis of toenail POA desires to treat-[place] nail lacquer 8% sol apply to toe nails qhs for 48 weeks, clean nails with alcohol  q7d.    Family/ Staff Communication: continue to monitor the  patient.   Goals of Care: SNF  Labs/tests ordered: none

## 2012-10-27 NOTE — Assessment & Plan Note (Signed)
The hypothyroidism is stable. TSH 3.547 01/28/11 Synthroid 

## 2012-10-27 NOTE — Assessment & Plan Note (Signed)
saw Psych Dr.Carr-Psychiatrist in the past. Off Cymbalta 60mg , seems better on Depakote 250mg /day--sometimes the patient sleeps through verbal/physical stimuli--will decrease Depakote to 125mg  po daily-monitor the patient.

## 2012-10-27 NOTE — Assessment & Plan Note (Signed)
blood pressure readings taken outside the office since the last visit have been in the target range.on Losartan 100mg qd and Clonidine 0.1mg qd        

## 2012-10-27 NOTE — Assessment & Plan Note (Signed)
No apparent edema in her dependent area- on Lasix 60mg and K 10meq daily            

## 2012-11-13 ENCOUNTER — Non-Acute Institutional Stay (SKILLED_NURSING_FACILITY): Payer: Medicare Other | Admitting: Nurse Practitioner

## 2012-11-13 ENCOUNTER — Encounter: Payer: Self-pay | Admitting: Nurse Practitioner

## 2012-11-13 DIAGNOSIS — E039 Hypothyroidism, unspecified: Secondary | ICD-10-CM

## 2012-11-13 DIAGNOSIS — F3289 Other specified depressive episodes: Secondary | ICD-10-CM

## 2012-11-13 DIAGNOSIS — I10 Essential (primary) hypertension: Secondary | ICD-10-CM

## 2012-11-13 DIAGNOSIS — I2510 Atherosclerotic heart disease of native coronary artery without angina pectoris: Secondary | ICD-10-CM

## 2012-11-13 DIAGNOSIS — G252 Other specified forms of tremor: Secondary | ICD-10-CM

## 2012-11-13 DIAGNOSIS — S5002XS Contusion of left elbow, sequela: Secondary | ICD-10-CM

## 2012-11-13 DIAGNOSIS — F329 Major depressive disorder, single episode, unspecified: Secondary | ICD-10-CM

## 2012-11-13 DIAGNOSIS — I509 Heart failure, unspecified: Secondary | ICD-10-CM

## 2012-11-13 DIAGNOSIS — S5002XA Contusion of left elbow, initial encounter: Secondary | ICD-10-CM | POA: Insufficient documentation

## 2012-11-13 DIAGNOSIS — R609 Edema, unspecified: Secondary | ICD-10-CM

## 2012-11-13 DIAGNOSIS — G250 Essential tremor: Secondary | ICD-10-CM

## 2012-11-13 DIAGNOSIS — IMO0002 Reserved for concepts with insufficient information to code with codable children: Secondary | ICD-10-CM

## 2012-11-13 NOTE — Assessment & Plan Note (Signed)
stable on Propranolol 40mg tid and Primidone 50mg qid    

## 2012-11-13 NOTE — Assessment & Plan Note (Signed)
The hypothyroidism is stable. TSH 3.547 01/28/11 Synthroid 175mcg     

## 2012-11-13 NOTE — Progress Notes (Signed)
Patient ID: [name], female   DOB: [birth year], 79 y.o.   MRN: [id number] Code Status: DNR  Allergies  Allergen Reactions  . Celebrex (Celecoxib)   . Citalopram   . Clarithromycin   . Dust Mite Extract   . Erythromycin   . Helidac (Metronid-Tetracyc-Bis Subsal)   . Mirapex (Pramipexole Dihydrochloride)   . Mold Extract (Trichophyton)   . Pramipexole   . Promethazine   . Sertraline   . Trimethobenzamide     Chief Complaint  Patient presents with  . Medical Managment of Chronic Issues    left elbow abrasion, the left 3rd finger erythema/fever/swelling.     HPI: Patient is a 79 y.o. female seen in the [facility] today for the left 3rd erythema and swelling and evaluation of her chronic medical conditions.  Problem List Items Addressed This Visit   Congestive heart failure, unspecified      acute on chronic diastolic heart failure, EF 45-50%, improved on Diuretic--Furosemide 60mg , no BLE edema-dc TED, moist rales diffused          Contusion of elbow, left - Primary     Left elbow and the left 3rd erythema, swelling, fever, but not pain no reduced ROM-the mechanism of injury is uncertain since the patient is unable to give any of HPI due to her advanced dementia. Continue to observe.     Coronary atherosclerosis of native coronary artery     s/p bypass in 1994, positive non-ST elevation MI this hospitalization. Comfort measures presently--not taking meds for risk reduction. On NTG--Imdur 30mg  daily.         Depressive disorder, not elsewhere classified      saw Psych Dr.Carr-Psychiatrist in the past. Off Cymbalta 60mg , seems better on Depakote 250mg /day--sometimes the patient sleeps through verbal/physical stimuli--stable on Depakote 125mg  po bid daily-monitor the patient.            Edema     No apparent edema in her dependent area- on Lasix 60mg  and K daily           Essential and other specified forms of tremor     stable on  Propranolol 40mg  tid and Primidone 50mg  qid           Unspecified essential hypertension     blood pressure readings taken outside the office since the last visit have been in the target range.on Losartan 100mg  qd and Clonidine 0.1mg  qd           Unspecified hypothyroidism     The hypothyroidism is stable. TSH 3.547 01/28/11 Synthroid             Review of Systems:  Review of Systems  Constitutional: Positive for malaise/fatigue. Negative for fever, chills, weight loss and diaphoresis.  HENT: Positive for hearing loss. Negative for congestion, sore throat, neck pain and ear discharge.   Eyes: Negative for pain, discharge and redness.  Respiratory: Positive for cough, sputum production and shortness of breath. Negative for wheezing.   Cardiovascular: Negative for chest pain, palpitations, orthopnea, claudication, leg swelling and PND.  Gastrointestinal: Negative for heartburn, nausea, vomiting, abdominal pain, diarrhea, constipation, blood in stool and melena.  Genitourinary: Positive for frequency (incontinent bladder). Negative for dysuria, urgency, hematuria and flank pain.  Musculoskeletal: Negative for myalgias, back pain, joint pain and falls.  Skin: Negative for itching and rash.       Thickened, yellow, brittle toe nails. The left elbow and the 3rd finger erythema/swelling/mild fever.  Neurological: Positive for weakness (generalized. ). Negative for dizziness, tingling, tremors, sensory change, speech change, focal weakness, seizures, loss of consciousness and headaches.  Endo/Heme/Allergies: Negative for environmental allergies and polydipsia. Does not bruise/bleed easily.  Psychiatric/Behavioral: Positive for depression and memory loss. Negative for hallucinations. The patient is not nervous/anxious and does not have insomnia.      Past Medical History  Diagnosis Date  . Unspecified hypothyroidism   . Anemia, unspecified   . Depressive disorder,  not elsewhere classified   . Essential and other specified forms of tremor   . Unspecified essential hypertension   . Coronary atherosclerosis of native coronary artery   . Congestive heart failure, unspecified   . Irritable bowel syndrome   . Edema      Medications: Reviewed [facility]   Physical Exam: Physical Exam  Constitutional: She appears well-developed and well-nourished.  HENT:  Head: Normocephalic and atraumatic.  Mouth/Throat: No oropharyngeal exudate.  Eyes: Conjunctivae and EOM are normal. Pupils are equal, round, and reactive to light. Right eye exhibits no discharge. Left eye exhibits no discharge. No scleral icterus.  Neck: Normal range of motion. Neck supple. No JVD present. No thyromegaly present.  Cardiovascular: Normal rate and regular rhythm.   Murmur heard.  Systolic murmur is present with a grade of 3/6  Pulmonary/Chest: Effort normal and breath sounds normal. No respiratory distress. She has no wheezes. She has no rales.  Abdominal: Soft. Bowel sounds are normal. She exhibits no distension. There is no tenderness. There is no rebound.  Musculoskeletal: Normal range of motion. She exhibits no edema and no tenderness.  Lymphadenopathy:    She has no cervical adenopathy.  Neurological: She is alert. She has normal reflexes. She displays normal reflexes. No cranial nerve deficit. She exhibits normal muscle tone. Coordination normal.  Skin: Skin is warm and dry. No rash noted. No erythema.  Yellow, thickened, brittle toe nails. The left elbow and 3rd finger erythema, mild swelling/fever.   Psychiatric: Her mood appears anxious. Her affect is inappropriate. Her affect is not angry. Her speech is not rapid and/or pressured, not delayed, not tangential and not slurred. She is agitated, hyperactive and slowed. She is not aggressive, not withdrawn, not actively hallucinating and not combative. Thought content is delusional. Thought content is not paranoid. Cognition and memory  are impaired. She expresses impulsivity and inappropriate judgment. She exhibits a depressed mood. She exhibits abnormal recent memory and abnormal remote memory.    Filed Vitals:   11/13/12 1640  BP: 146/94  Pulse: 90  Temp: 98.7 F (37.1 C)  TempSrc: Tympanic  Resp: 16     Assessment/Plan Contusion of elbow, left Left elbow and the left 3rd erythema, swelling, fever, but not pain no reduced ROM-the mechanism of injury is uncertain since the patient is unable to give any of HPI due to her advanced dementia. Continue to observe.   Unspecified hypothyroidism The hypothyroidism is stable. TSH 3.547 01/28/11 Synthroid        Edema No apparent edema in her dependent area- on Lasix 60mg  and K daily         Unspecified essential hypertension blood pressure readings taken outside the office since the last visit have been in the target range.on Losartan 100mg  qd and Clonidine 0.1mg  qd         Coronary atherosclerosis of native coronary artery s/p bypass in 1994, positive non-ST elevation MI this hospitalization. Comfort measures presently--not taking meds for risk reduction. On NTG--Imdur 30mg  daily.  Congestive heart failure, unspecified  acute on chronic diastolic heart failure, EF 45-50%, improved on Diuretic--Furosemide 60mg , no BLE edema-dc TED, moist rales diffused        Essential and other specified forms of tremor stable on Propranolol 40mg  tid and Primidone 50mg  qid         Depressive disorder, not elsewhere classified  saw Psych Dr.Carr-Psychiatrist in the past. Off Cymbalta 60mg , seems better on Depakote 250mg /day--sometimes the patient sleeps through verbal/physical stimuli--stable on Depakote 125mg  po bid daily-monitor the patient.            Family/ Staff Communication: Observe the patient.   Goals of Care: SNF  Labs/tests ordered: none

## 2012-11-13 NOTE — Assessment & Plan Note (Signed)
blood pressure readings taken outside the office since the last visit have been in the target range.on Losartan 100mg  qd and Clonidine 0.1mg  qd

## 2012-11-13 NOTE — Assessment & Plan Note (Signed)
acute on chronic diastolic heart failure, EF 45-50%, improved on Diuretic--Furosemide 60mg , no BLE edema-dc TED, moist rales diffused

## 2012-11-13 NOTE — Assessment & Plan Note (Addendum)
Left elbow and the left 3rd erythema, swelling, fever, but not pain no reduced ROM-the mechanism of injury is uncertain since the patient is unable to give any of HPI due to her advanced dementia. Continue to observe.

## 2012-11-13 NOTE — Assessment & Plan Note (Signed)
saw Psych Dr.Carr-Psychiatrist in the past. Off Cymbalta 60mg , seems better on Depakote 250mg /day--sometimes the patient sleeps through verbal/physical stimuli--stable on Depakote 125mg  po bid daily-monitor the patient.

## 2012-11-13 NOTE — Assessment & Plan Note (Signed)
s/p bypass in 1994, positive non-ST elevation MI this hospitalization. Comfort measures presently--not taking meds for risk reduction. On NTG--Imdur 30mg daily.        

## 2012-11-13 NOTE — Assessment & Plan Note (Signed)
No apparent edema in her dependent area- on Lasix 60mg and K 10meq daily            

## 2012-11-19 ENCOUNTER — Encounter: Payer: Self-pay | Admitting: Internal Medicine

## 2012-12-11 ENCOUNTER — Encounter: Payer: Self-pay | Admitting: Nurse Practitioner

## 2012-12-11 ENCOUNTER — Non-Acute Institutional Stay (SKILLED_NURSING_FACILITY): Payer: Medicare Other | Admitting: Nurse Practitioner

## 2012-12-11 DIAGNOSIS — I509 Heart failure, unspecified: Secondary | ICD-10-CM

## 2012-12-11 DIAGNOSIS — R609 Edema, unspecified: Secondary | ICD-10-CM

## 2012-12-11 DIAGNOSIS — E039 Hypothyroidism, unspecified: Secondary | ICD-10-CM

## 2012-12-11 DIAGNOSIS — Z66 Do not resuscitate: Secondary | ICD-10-CM | POA: Insufficient documentation

## 2012-12-11 DIAGNOSIS — F3289 Other specified depressive episodes: Secondary | ICD-10-CM

## 2012-12-11 DIAGNOSIS — G250 Essential tremor: Secondary | ICD-10-CM

## 2012-12-11 DIAGNOSIS — I10 Essential (primary) hypertension: Secondary | ICD-10-CM

## 2012-12-11 DIAGNOSIS — I2510 Atherosclerotic heart disease of native coronary artery without angina pectoris: Secondary | ICD-10-CM

## 2012-12-11 DIAGNOSIS — F329 Major depressive disorder, single episode, unspecified: Secondary | ICD-10-CM

## 2012-12-11 DIAGNOSIS — M1990 Unspecified osteoarthritis, unspecified site: Secondary | ICD-10-CM

## 2012-12-11 NOTE — Assessment & Plan Note (Signed)
Managed with Tylenol 500mg  qid.

## 2012-12-11 NOTE — Assessment & Plan Note (Signed)
acute on chronic diastolic heart failure, EF 45-50%, improved on Diuretic--Furosemide 60mg , no BLE edema,  moist rales diffused

## 2012-12-11 NOTE — Assessment & Plan Note (Signed)
Intentional tremor and spastic all in all limbs,  on Propranolol 40mg  tid and Primidone 50mg  qid

## 2012-12-11 NOTE — Progress Notes (Signed)
Patient ID: [name], female   DOB: 09-24-1917, 79 y.o.   MRN: [id number] Code Status: DNR  Allergies  Allergen Reactions  . Celebrex [Celecoxib]   . Citalopram   . Clarithromycin   . Dust Mite Extract   . Erythromycin   . Helidac [Metronid-Tetracyc-Bis Subsal]   . Mirapex [Pramipexole Dihydrochloride]   . Mold Extract [Trichophyton]   . Pramipexole   . Promethazine   . Sertraline   . Trimethobenzamide     Chief Complaint  Patient presents with  . Medical Managment of Chronic Issues    HPI: Patient is a 79 y.o. female seen in the [facility] at [facility] today for evaluation of her chronic medical conditions.  Problem List Items Addressed This Visit   Congestive heart failure, unspecified      acute on chronic diastolic heart failure, EF 45-50%, improved on Diuretic--Furosemide 60mg , no BLE edema,  moist rales diffused            Coronary atherosclerosis of native coronary artery     s/p bypass in 1994, positive non-ST elevation MI this hospitalization. Comfort measures presently--not taking meds for risk reduction. On NTG--Imdur 30mg  daily.           Depressive disorder, not elsewhere classified      saw Psych Dr.Carr-Psychiatrist in the past. Off Cymbalta 60mg , seems better on Depakote 250mg /day--sometimes the patient sleeps through verbal/physical stimuli--stable on Depakote 125mg  po bid daily-monitor the patient.              DNR (do not resuscitate) - Primary   Edema     No apparent edema in her dependent area- on Lasix 60mg  and K daily             Essential and other specified forms of tremor     Intentional tremor and spastic all in all limbs,  on Propranolol 40mg  tid and Primidone 50mg  qid             Osteoarthritis     Managed with Tylenol 500mg  qid.     Unspecified essential hypertension     blood pressure readings taken outside the office since the last visit have been in the target range.on Losartan 100mg  qd  and Clonidine 0.1mg  qd                      Unspecified hypothyroidism     The hypothyroidism is stable. TSH 3.547 01/28/11 Synthroid               Review of Systems:  Review of Systems  Constitutional: Negative for fever, chills, weight loss, malaise/fatigue and diaphoresis.  HENT: Positive for hearing loss. Negative for congestion, sore throat, neck pain and ear discharge.   Eyes: Negative for pain, discharge and redness.  Respiratory: Positive for cough, sputum production and shortness of breath. Negative for wheezing.   Cardiovascular: Negative for chest pain, palpitations, orthopnea, claudication, leg swelling and PND.  Gastrointestinal: Negative for heartburn, nausea, vomiting, abdominal pain, diarrhea, constipation, blood in stool and melena.  Genitourinary: Positive for frequency (incontinent bladder). Negative for dysuria, urgency, hematuria and flank pain.  Musculoskeletal: Negative for myalgias, back pain, joint pain and falls.  Skin: Negative for itching and rash.       Thickened, yellow, brittle toe nails.   Neurological: Positive for weakness (generalized. ). Negative for dizziness, tingling, tremors, sensory change, speech change, focal weakness, seizures, loss of consciousness and headaches.  Intentional tremor and spasticity in all limbs.   Endo/Heme/Allergies: Negative for environmental allergies and polydipsia. Does not bruise/bleed easily.  Psychiatric/Behavioral: Positive for depression and memory loss. Negative for hallucinations. The patient is not nervous/anxious and does not have insomnia.      Past Medical History  Diagnosis Date  . Unspecified hypothyroidism   . Anemia, unspecified   . Depressive disorder, not elsewhere classified   . Essential and other specified forms of tremor   . Unspecified essential hypertension   . Coronary atherosclerosis of native coronary artery   . Congestive heart failure, unspecified   .  Irritable bowel syndrome   . Edema      Medications: Reviewed [facility]   Physical Exam: Physical Exam  Constitutional: She appears well-developed and well-nourished.  HENT:  Head: Normocephalic and atraumatic.  Mouth/Throat: No oropharyngeal exudate.  Eyes: Conjunctivae and EOM are normal. Pupils are equal, round, and reactive to light. Right eye exhibits no discharge. Left eye exhibits no discharge. No scleral icterus.  Neck: Normal range of motion. Neck supple. No JVD present. No thyromegaly present.  Cardiovascular: Normal rate and regular rhythm.   Murmur heard.  Systolic murmur is present with a grade of 3/6  Pulmonary/Chest: Effort normal and breath sounds normal. No respiratory distress. She has no wheezes. She has no rales.  Abdominal: Soft. Bowel sounds are normal. She exhibits no distension. There is no tenderness. There is no rebound.  Musculoskeletal: Normal range of motion. She exhibits no edema and no tenderness.  Lymphadenopathy:    She has no cervical adenopathy.  Neurological: She is alert. She has normal reflexes. No cranial nerve deficit. She exhibits normal muscle tone. Coordination normal.  Skin: Skin is warm and dry. No rash noted. No erythema.  Yellow, thickened, brittle toe nails.   Psychiatric: Her mood appears anxious. Her affect is inappropriate. Her affect is not angry. Her speech is not rapid and/or pressured, not delayed, not tangential and not slurred. She is agitated, hyperactive and slowed. She is not aggressive, not withdrawn, not actively hallucinating and not combative. Thought content is not paranoid and not delusional. Cognition and memory are impaired. She expresses impulsivity and inappropriate judgment. She exhibits a depressed mood. She exhibits abnormal recent memory and abnormal remote memory.    Filed Vitals:   12/11/12 1331  BP: 112/62  Pulse: 62  Temp: 98.8 F (37.1 C)  TempSrc: Tympanic  Resp: 18     Assessment/Plan Unspecified  essential hypertension blood pressure readings taken outside the office since the last visit have been in the target range.on Losartan 100mg  qd and Clonidine 0.1mg  qd                    Essential and other specified forms of tremor Intentional tremor and spastic all in all limbs,  on Propranolol 40mg  tid and Primidone 50mg  qid           Edema No apparent edema in her dependent area- on Lasix 60mg  and K daily           Congestive heart failure, unspecified  acute on chronic diastolic heart failure, EF 45-50%, improved on Diuretic--Furosemide 60mg , no BLE edema,  moist rales diffused          Coronary atherosclerosis of native coronary artery s/p bypass in 1994, positive non-ST elevation MI this hospitalization. Comfort measures presently--not taking meds for risk reduction. On NTG--Imdur 30mg  daily.         Unspecified hypothyroidism The hypothyroidism is  stable. TSH 3.547 01/28/11 Synthroid          Depressive disorder, not elsewhere classified  saw Psych Dr.Carr-Psychiatrist in the past. Off Cymbalta 60mg , seems better on Depakote 250mg /day--sometimes the patient sleeps through verbal/physical stimuli--stable on Depakote 125mg  po bid daily-monitor the patient.            Osteoarthritis Managed with Tylenol 500mg  qid.     Family/ Staff Communication: Observe the patient.   Goals of Care: SNF  Labs/tests ordered: none

## 2012-12-11 NOTE — Assessment & Plan Note (Signed)
No apparent edema in her dependent area- on Lasix 60mg  and K daily

## 2012-12-11 NOTE — Assessment & Plan Note (Signed)
s/p bypass in 1994, positive non-ST elevation MI this hospitalization. Comfort measures presently--not taking meds for risk reduction. On NTG--Imdur 30mg  daily.

## 2012-12-11 NOTE — Assessment & Plan Note (Signed)
blood pressure readings taken outside the office since the last visit have been in the target range.on Losartan 100mg qd and Clonidine 0.1mg qd        

## 2012-12-11 NOTE — Assessment & Plan Note (Signed)
The hypothyroidism is stable. TSH 3.547 01/28/11 Synthroid 175mcg     

## 2012-12-11 NOTE — Assessment & Plan Note (Signed)
saw Psych Dr.Carr-Psychiatrist in the past. Off Cymbalta 60mg, seems better on Depakote 250mg/day--sometimes the patient sleeps through verbal/physical stimuli--stable on Depakote 125mg po bid daily-monitor the patient.             

## 2013-01-12 ENCOUNTER — Encounter: Payer: Self-pay | Admitting: Nurse Practitioner

## 2013-01-12 ENCOUNTER — Non-Acute Institutional Stay (SKILLED_NURSING_FACILITY): Payer: Medicare Other | Admitting: Nurse Practitioner

## 2013-01-12 DIAGNOSIS — R609 Edema, unspecified: Secondary | ICD-10-CM

## 2013-01-12 DIAGNOSIS — I2510 Atherosclerotic heart disease of native coronary artery without angina pectoris: Secondary | ICD-10-CM

## 2013-01-12 DIAGNOSIS — E039 Hypothyroidism, unspecified: Secondary | ICD-10-CM

## 2013-01-12 DIAGNOSIS — G250 Essential tremor: Secondary | ICD-10-CM

## 2013-01-12 DIAGNOSIS — I10 Essential (primary) hypertension: Secondary | ICD-10-CM

## 2013-01-12 DIAGNOSIS — M1990 Unspecified osteoarthritis, unspecified site: Secondary | ICD-10-CM

## 2013-01-12 DIAGNOSIS — K589 Irritable bowel syndrome without diarrhea: Secondary | ICD-10-CM

## 2013-01-12 DIAGNOSIS — F329 Major depressive disorder, single episode, unspecified: Secondary | ICD-10-CM

## 2013-01-12 NOTE — Assessment & Plan Note (Signed)
No apparent edema in her dependent area- on Lasix 60mg  and K daily

## 2013-01-12 NOTE — Assessment & Plan Note (Signed)
blood pressure readings taken outside the office since the last visit have been in the target range.on Losartan 100mg qd and Clonidine 0.1mg qd        

## 2013-01-12 NOTE — Assessment & Plan Note (Signed)
Intentional tremor and spastic all in all limbs,  on Propranolol 40mg tid and Primidone 50mg qid          

## 2013-01-12 NOTE — Progress Notes (Signed)
Patient ID: [name], female   DOB: [birth year], 79 y.o.   MRN: [id number]  Code Status: DNR  Allergies  Allergen Reactions  . Celebrex [Celecoxib]   . Citalopram   . Clarithromycin   . Dust Mite Extract   . Erythromycin   . Helidac [Metronid-Tetracyc-Bis Subsal]   . Mirapex [Pramipexole Dihydrochloride]   . Mold Extract [Trichophyton]   . Pramipexole   . Promethazine   . Sertraline   . Trimethobenzamide     Chief Complaint  Patient presents with  . Medical Managment of Chronic Issues    HPI: Patient is a 79 y.o. female seen in the [facility] at [facility] today for evaluation of her chronic medical conditions.  Problem List Items Addressed This Visit   Coronary atherosclerosis of native coronary artery     s/p bypass in 1994, positive non-ST elevation MI this hospitalization. Comfort measures presently--not taking meds for risk reduction. On NTG--Imdur 30mg  daily.           Depressive disorder, not elsewhere classified      saw Psych Dr.Carr-Psychiatrist in the past. Off Cymbalta 60mg , seems better on Depakote 250mg /day--sometimes the patient sleeps through verbal/physical stimuli--stable on Depakote 125mg  po bid daily-monitor the patient.                Edema     No apparent edema in her dependent area- on Lasix 60mg  and K daily               Essential and other specified forms of tremor     Intentional tremor and spastic all in all limbs,  on Propranolol 40mg  tid and Primidone 50mg  qid               Irritable bowel syndrome     Stable on Probiotic and Senna    Osteoarthritis     Managed with Tylenol 500mg  qid.       Unspecified essential hypertension - Primary     blood pressure readings taken outside the office since the last visit have been in the target range.on Losartan 100mg  qd and Clonidine 0.1mg  qd                        Unspecified hypothyroidism     The hypothyroidism is stable. TSH  3.547 01/28/11 Synthroid           Review of Systems:  Review of Systems  Constitutional: Negative for fever, chills, weight loss, malaise/fatigue and diaphoresis.  HENT: Positive for hearing loss. Negative for congestion, sore throat, neck pain and ear discharge.   Eyes: Negative for pain, discharge and redness.  Respiratory: Positive for cough, sputum production and shortness of breath. Negative for wheezing.   Cardiovascular: Negative for chest pain, palpitations, orthopnea, claudication, leg swelling and PND.  Gastrointestinal: Negative for heartburn, nausea, vomiting, abdominal pain, diarrhea, constipation, blood in stool and melena.  Genitourinary: Positive for frequency (incontinent bladder). Negative for dysuria, urgency, hematuria and flank pain.  Musculoskeletal: Negative for myalgias, back pain, joint pain and falls.  Skin: Negative for itching and rash.       Thickened, yellow, brittle toe nails.   Neurological: Positive for weakness (generalized. ). Negative for dizziness, tingling, tremors, sensory change, speech change, focal weakness, seizures, loss of consciousness and headaches.       Intentional tremor and spasticity in all limbs.   Endo/Heme/Allergies: Negative for environmental allergies and polydipsia. Does not bruise/bleed easily.  Psychiatric/Behavioral: Positive  for depression and memory loss. Negative for hallucinations. The patient is not nervous/anxious and does not have insomnia.      Past Medical History  Diagnosis Date  . Unspecified hypothyroidism   . Anemia, unspecified   . Depressive disorder, not elsewhere classified   . Essential and other specified forms of tremor   . Unspecified essential hypertension   . Coronary atherosclerosis of native coronary artery   . Congestive heart failure, unspecified   . Irritable bowel syndrome   . Edema      Medications: Reviewed [facility]   Physical Exam: Physical Exam  Constitutional: She appears  well-developed and well-nourished.  HENT:  Head: Normocephalic and atraumatic.  Mouth/Throat: No oropharyngeal exudate.  Eyes: Conjunctivae and EOM are normal. Pupils are equal, round, and reactive to light. Right eye exhibits no discharge. Left eye exhibits no discharge. No scleral icterus.  Neck: Normal range of motion. Neck supple. No JVD present. No thyromegaly present.  Cardiovascular: Normal rate and regular rhythm.   Murmur heard.  Systolic murmur is present with a grade of 3/6  Pulmonary/Chest: Effort normal and breath sounds normal. No respiratory distress. She has no wheezes. She has no rales.  Abdominal: Soft. Bowel sounds are normal. She exhibits no distension. There is no tenderness. There is no rebound.  Musculoskeletal: Normal range of motion. She exhibits no edema and no tenderness.  Lymphadenopathy:    She has no cervical adenopathy.  Neurological: She is alert. She has normal reflexes. No cranial nerve deficit. She exhibits normal muscle tone. Coordination normal.  Skin: Skin is warm and dry. No rash noted. No erythema.  Yellow, thickened, brittle toe nails.   Psychiatric: Her mood appears anxious. Her affect is inappropriate. Her affect is not angry. Her speech is not rapid and/or pressured, not delayed, not tangential and not slurred. She is agitated, hyperactive and slowed. She is not aggressive, not withdrawn, not actively hallucinating and not combative. Thought content is not paranoid and not delusional. Cognition and memory are impaired. She expresses impulsivity and inappropriate judgment. She exhibits a depressed mood. She exhibits abnormal recent memory and abnormal remote memory.    Filed Vitals:   01/12/13 1347  BP: 114/60  Pulse: 68  Temp: 98.7 F (37.1 C)  TempSrc: Tympanic  Resp: 18     Assessment/Plan Unspecified essential hypertension blood pressure readings taken outside the office since the last visit have been in the target range.on Losartan  100mg  qd and Clonidine 0.1mg  qd                      Osteoarthritis Managed with Tylenol 500mg  qid.     Essential and other specified forms of tremor Intentional tremor and spastic all in all limbs,  on Propranolol 40mg  tid and Primidone 50mg  qid             Edema No apparent edema in her dependent area- on Lasix 60mg  and K daily             Depressive disorder, not elsewhere classified  saw Psych Dr.Carr-Psychiatrist in the past. Off Cymbalta 60mg , seems better on Depakote 250mg /day--sometimes the patient sleeps through verbal/physical stimuli--stable on Depakote 125mg  po bid daily-monitor the patient.              Coronary atherosclerosis of native coronary artery s/p bypass in 1994, positive non-ST elevation MI this hospitalization. Comfort measures presently--not taking meds for risk reduction. On NTG--Imdur 30mg  daily.  Unspecified hypothyroidism The hypothyroidism is stable. TSH 3.547 01/28/11 Synthroid      Irritable bowel syndrome Stable on Probiotic and Senna    Family/ Staff Communication: Observe the patient.   Goals of Care: SNF  Labs/tests ordered: none

## 2013-01-12 NOTE — Assessment & Plan Note (Signed)
The hypothyroidism is stable. TSH 3.547 01/28/11 Synthroid 175mcg     

## 2013-01-12 NOTE — Assessment & Plan Note (Signed)
Stable on Probiotic and Senna

## 2013-01-12 NOTE — Assessment & Plan Note (Signed)
saw Psych Dr.Carr-Psychiatrist in the past. Off Cymbalta 60mg , seems better on Depakote 250mg /day--sometimes the patient sleeps through verbal/physical stimuli--stable on Depakote 125mg  po bid daily-monitor the patient.

## 2013-01-12 NOTE — Assessment & Plan Note (Signed)
s/p bypass in 1994, positive non-ST elevation MI this hospitalization. Comfort measures presently--not taking meds for risk reduction. On NTG--Imdur 30mg daily.        

## 2013-01-12 NOTE — Progress Notes (Signed)
This encounter was created in error - please disregard.

## 2013-01-12 NOTE — Assessment & Plan Note (Signed)
Managed with Tylenol 500mg qid.  

## 2013-02-09 ENCOUNTER — Non-Acute Institutional Stay (SKILLED_NURSING_FACILITY): Payer: Medicare Other | Admitting: Nurse Practitioner

## 2013-02-09 ENCOUNTER — Encounter: Payer: Self-pay | Admitting: Nurse Practitioner

## 2013-02-09 DIAGNOSIS — D649 Anemia, unspecified: Secondary | ICD-10-CM

## 2013-02-09 DIAGNOSIS — I10 Essential (primary) hypertension: Secondary | ICD-10-CM

## 2013-02-09 DIAGNOSIS — G250 Essential tremor: Secondary | ICD-10-CM

## 2013-02-09 DIAGNOSIS — E039 Hypothyroidism, unspecified: Secondary | ICD-10-CM

## 2013-02-09 DIAGNOSIS — F329 Major depressive disorder, single episode, unspecified: Secondary | ICD-10-CM

## 2013-02-09 DIAGNOSIS — I509 Heart failure, unspecified: Secondary | ICD-10-CM

## 2013-02-09 DIAGNOSIS — M1990 Unspecified osteoarthritis, unspecified site: Secondary | ICD-10-CM

## 2013-02-09 DIAGNOSIS — I2510 Atherosclerotic heart disease of native coronary artery without angina pectoris: Secondary | ICD-10-CM

## 2013-02-09 NOTE — Assessment & Plan Note (Signed)
blood pressure readings taken outside the office since the last visit have been in the target range.on Losartan 100mg  qd and Clonidine 0.1mg  qd

## 2013-02-09 NOTE — Assessment & Plan Note (Signed)
Hgb 11.6 11/29/10--Hgb 10.8 01/28/11 off Iron

## 2013-02-09 NOTE — Assessment & Plan Note (Signed)
Managed with Tylenol 500mg qid.  

## 2013-02-09 NOTE — Assessment & Plan Note (Signed)
saw Psych Dr.Carr-Psychiatrist in the past. Off Cymbalta 60mg, seems better on Depakote 250mg/day--sometimes the patient sleeps through verbal/physical stimuli--stable on Depakote 125mg po bid daily-monitor the patient.             

## 2013-02-09 NOTE — Assessment & Plan Note (Signed)
Intentional tremor and spastic all in all limbs,  on Propranolol 40mg tid and Primidone 50mg qid          

## 2013-02-09 NOTE — Assessment & Plan Note (Signed)
s/p bypass in 1994, positive non-ST elevation MI this hospitalization. Comfort measures presently--not taking meds for risk reduction. On NTG--Imdur 30mg  daily.

## 2013-02-09 NOTE — Progress Notes (Signed)
Patient ID: [name], female   DOB: Aug 12, 1917, 79 y.o.   MRN: [id number]  Code Status: DNR  Allergies  Allergen Reactions  . Celebrex [Celecoxib]   . Citalopram   . Clarithromycin   . Dust Mite Extract   . Erythromycin   . Helidac [Metronid-Tetracyc-Bis Subsal]   . Mirapex [Pramipexole Dihydrochloride]   . Mold Extract [Trichophyton]   . Pramipexole   . Promethazine   . Sertraline   . Trimethobenzamide     Chief Complaint  Patient presents with  . Medical Managment of Chronic Issues    HPI: Patient is a 79 y.o. female seen in the [facility] at [facility] today for evaluation of her chronic medical conditions.  Problem List Items Addressed This Visit   Anemia, unspecified - Primary      Hgb 11.6 11/29/10--Hgb 10.8 01/28/11 off Iron      Congestive heart failure, unspecified      acute on chronic diastolic heart failure, EF 45-50%, improved on Diuretic--Furosemide 60mg , no BLE edema,  moist rales diffused              Coronary atherosclerosis of native coronary artery     s/p bypass in 1994, positive non-ST elevation MI this hospitalization. Comfort measures presently--not taking meds for risk reduction. On NTG--Imdur 30mg  daily.             Depressive disorder, not elsewhere classified      saw Psych Dr.Carr-Psychiatrist in the past. Off Cymbalta 60mg , seems better on Depakote 250mg /day--sometimes the patient sleeps through verbal/physical stimuli--stable on Depakote 125mg  po bid daily-monitor the patient.                  Essential and other specified forms of tremor     Intentional tremor and spastic all in all limbs,  on Propranolol 40mg  tid and Primidone 50mg  qid                 Osteoarthritis     Managed with Tylenol 500mg  qid.         Unspecified essential hypertension     blood pressure readings taken outside the office since the last visit have been in the target range.on Losartan 100mg  qd and Clonidine 0.1mg   qd                          Unspecified hypothyroidism     The hypothyroidism is stable. TSH 3.547 01/28/11 Synthroid             Review of Systems:  Review of Systems  Constitutional: Negative for fever, chills, weight loss, malaise/fatigue and diaphoresis.  HENT: Positive for hearing loss. Negative for congestion, ear discharge and sore throat.   Eyes: Negative for pain, discharge and redness.  Respiratory: Positive for cough, sputum production and shortness of breath. Negative for wheezing.   Cardiovascular: Negative for chest pain, palpitations, orthopnea, claudication, leg swelling and PND.  Gastrointestinal: Negative for heartburn, nausea, vomiting, abdominal pain, diarrhea, constipation, blood in stool and melena.  Genitourinary: Positive for frequency (incontinent bladder). Negative for dysuria, urgency, hematuria and flank pain.  Musculoskeletal: Negative for back pain, falls, joint pain, myalgias and neck pain.  Skin: Negative for itching and rash.       Thickened, yellow, brittle toe nails.   Neurological: Positive for weakness (generalized. ). Negative for dizziness, tingling, tremors, sensory change, speech change, focal weakness, seizures, loss of consciousness and headaches.  Intentional tremor and spasticity in all limbs.   Endo/Heme/Allergies: Negative for environmental allergies and polydipsia. Does not bruise/bleed easily.  Psychiatric/Behavioral: Positive for depression and memory loss. Negative for hallucinations. The patient is not nervous/anxious and does not have insomnia.      Past Medical History  Diagnosis Date  . Unspecified hypothyroidism   . Anemia, unspecified   . Depressive disorder, not elsewhere classified   . Essential and other specified forms of tremor   . Unspecified essential hypertension   . Coronary atherosclerosis of native coronary artery   . Congestive heart failure, unspecified   . Irritable bowel  syndrome   . Edema      Medications: Reviewed [facility]   Physical Exam: Physical Exam  Constitutional: She appears well-developed and well-nourished.  HENT:  Head: Normocephalic and atraumatic.  Mouth/Throat: No oropharyngeal exudate.  Eyes: Conjunctivae and EOM are normal. Pupils are equal, round, and reactive to light. Right eye exhibits no discharge. Left eye exhibits no discharge. No scleral icterus.  Neck: Normal range of motion. Neck supple. No JVD present. No thyromegaly present.  Cardiovascular: Normal rate and regular rhythm.   Murmur heard.  Systolic murmur is present with a grade of 3/6  Pulmonary/Chest: Effort normal and breath sounds normal. No respiratory distress. She has no wheezes. She has no rales.  Abdominal: Soft. Bowel sounds are normal. She exhibits no distension. There is no tenderness. There is no rebound.  Musculoskeletal: Normal range of motion. She exhibits no edema and no tenderness.  Lymphadenopathy:    She has no cervical adenopathy.  Neurological: She is alert. She has normal reflexes. No cranial nerve deficit. She exhibits normal muscle tone. Coordination normal.  Skin: Skin is warm and dry. No rash noted. No erythema.  Yellow, thickened, brittle toe nails.   Psychiatric: Her mood appears anxious. Her affect is inappropriate. Her affect is not angry. Her speech is not rapid and/or pressured, not delayed, not tangential and not slurred. She is agitated, hyperactive and slowed. She is not aggressive, not withdrawn, not actively hallucinating and not combative. Thought content is not paranoid and not delusional. Cognition and memory are impaired. She expresses impulsivity and inappropriate judgment. She exhibits a depressed mood. She exhibits abnormal recent memory and abnormal remote memory.    Filed Vitals:   02/09/13 1429  BP: 114/83  Pulse: 90  Temp: 98.3 F (36.8 C)  TempSrc: Tympanic  Resp: 17     Assessment/Plan Anemia, unspecified  Hgb 11.6  11/29/10--Hgb 10.8 01/28/11 off Iron    Congestive heart failure, unspecified  acute on chronic diastolic heart failure, EF 45-50%, improved on Diuretic--Furosemide 60mg , no BLE edema,  moist rales diffused            Unspecified essential hypertension blood pressure readings taken outside the office since the last visit have been in the target range.on Losartan 100mg  qd and Clonidine 0.1mg  qd                        Coronary atherosclerosis of native coronary artery s/p bypass in 1994, positive non-ST elevation MI this hospitalization. Comfort measures presently--not taking meds for risk reduction. On NTG--Imdur 30mg  daily.           Unspecified hypothyroidism The hypothyroidism is stable. TSH 3.547 01/28/11 Synthroid        Depressive disorder, not elsewhere classified  saw Psych Dr.Carr-Psychiatrist in the past. Off Cymbalta 60mg , seems better on Depakote 250mg /day--sometimes the patient sleeps through verbal/physical  stimuli--stable on Depakote 125mg  po bid daily-monitor the patient.                Essential and other specified forms of tremor Intentional tremor and spastic all in all limbs,  on Propranolol 40mg  tid and Primidone 50mg  qid               Osteoarthritis Managed with Tylenol 500mg  qid.         Family/ Staff Communication: Observe the patient.   Goals of Care: SNF  Labs/tests ordered: none

## 2013-02-09 NOTE — Assessment & Plan Note (Signed)
The hypothyroidism is stable. TSH 3.547 01/28/11 Synthroid 175mcg     

## 2013-02-09 NOTE — Assessment & Plan Note (Signed)
acute on chronic diastolic heart failure, EF 45-50%, improved on Diuretic--Furosemide 60mg, no BLE edema,  moist rales diffused         

## 2013-02-26 ENCOUNTER — Non-Acute Institutional Stay (SKILLED_NURSING_FACILITY): Payer: Medicare Other | Admitting: Nurse Practitioner

## 2013-02-26 ENCOUNTER — Encounter: Payer: Self-pay | Admitting: Nurse Practitioner

## 2013-02-26 DIAGNOSIS — I10 Essential (primary) hypertension: Secondary | ICD-10-CM

## 2013-02-26 DIAGNOSIS — E039 Hypothyroidism, unspecified: Secondary | ICD-10-CM

## 2013-02-26 DIAGNOSIS — G250 Essential tremor: Secondary | ICD-10-CM

## 2013-02-26 DIAGNOSIS — F329 Major depressive disorder, single episode, unspecified: Secondary | ICD-10-CM

## 2013-02-26 DIAGNOSIS — M1990 Unspecified osteoarthritis, unspecified site: Secondary | ICD-10-CM

## 2013-02-26 DIAGNOSIS — I509 Heart failure, unspecified: Secondary | ICD-10-CM

## 2013-02-26 NOTE — Assessment & Plan Note (Signed)
Managed with Tylenol 500mg  qid.

## 2013-02-26 NOTE — Assessment & Plan Note (Signed)
saw Psych Dr.Carr-Psychiatrist in the past. Off Cymbalta 60mg , sometimes the patient sleeps through verbal/physical stimuli--stable on Depakote 125mg  po bid daily-failed gradual dose reduction: yelling out more frequently since Depakote was reduced to 125mg  daily--will resumed Depakote 125mg  bid. Observe the patient.

## 2013-02-26 NOTE — Progress Notes (Signed)
Patient ID: [name], female   DOB: [birth year], 79 y.o.   MRN: [id number]  Code Status: DNR  Allergies  Allergen Reactions  . Celebrex [Celecoxib]   . Citalopram   . Clarithromycin   . Dust Mite Extract   . Erythromycin   . Helidac [Metronid-Tetracyc-Bis Subsal]   . Mirapex [Pramipexole Dihydrochloride]   . Mold Extract [Trichophyton]   . Pramipexole   . Promethazine   . Sertraline   . Trimethobenzamide     Chief Complaint  Patient presents with  . Medical Managment of Chronic Issues    increased yelling out  . Acute Visit    HPI: Patient is a 80 y.o. female seen in the [facility] at [facility] today for evaluation of her behaviors-yelling out and chronic medical conditions.  Problem List Items Addressed This Visit   Congestive heart failure, unspecified      acute on chronic diastolic heart failure, EF 45-50%, improved on Diuretic--Furosemide 60mg , no BLE edema,  moist rales diffused                Depressive disorder, not elsewhere classified - Primary      saw Psych Dr.Carr-Psychiatrist in the past. Off Cymbalta 60mg , sometimes the patient sleeps through verbal/physical stimuli--stable on Depakote 125mg  po bid daily-failed gradual dose reduction: yelling out more frequently since Depakote was reduced to 125mg  daily--will resumed Depakote 125mg  bid. Observe the patient.                    Essential and other specified forms of tremor     Intentional tremor and spastic all in all limbs,  on Propranolol 40mg  tid and Primidone 50mg  qid                   Osteoarthritis     Managed with Tylenol 500mg  qid.           Unspecified essential hypertension     blood pressure readings taken outside the office since the last visit have been in the target range.on Losartan 100mg  qd and Clonidine 0.1mg  qd                            Unspecified hypothyroidism     The hypothyroidism is stable. TSH 3.547  01/28/11 Synthroid               Review of Systems:  Review of Systems  Constitutional: Negative for fever, chills, weight loss, malaise/fatigue and diaphoresis.  HENT: Positive for hearing loss. Negative for congestion, ear discharge and sore throat.   Eyes: Negative for pain, discharge and redness.  Respiratory: Positive for cough, sputum production and shortness of breath. Negative for wheezing.   Cardiovascular: Negative for chest pain, palpitations, orthopnea, claudication, leg swelling and PND.  Gastrointestinal: Negative for heartburn, nausea, vomiting, abdominal pain, diarrhea, constipation, blood in stool and melena.  Genitourinary: Positive for frequency (incontinent bladder). Negative for dysuria, urgency, hematuria and flank pain.  Musculoskeletal: Negative for back pain, falls, joint pain, myalgias and neck pain.  Skin: Negative for itching and rash.       Thickened, yellow, brittle toe nails.   Neurological: Positive for weakness (generalized. ). Negative for dizziness, tingling, tremors, sensory change, speech change, focal weakness, seizures, loss of consciousness and headaches.       Intentional tremor and spasticity in all limbs.   Endo/Heme/Allergies: Negative for environmental allergies and polydipsia. Does not bruise/bleed easily.  Psychiatric/Behavioral: Positive for depression and memory loss. Negative for hallucinations. The patient is not nervous/anxious and does not have insomnia.        Yelling out loudly     Past Medical History  Diagnosis Date  . Unspecified hypothyroidism   . Anemia, unspecified   . Depressive disorder, not elsewhere classified   . Essential and other specified forms of tremor   . Unspecified essential hypertension   . Coronary atherosclerosis of native coronary artery   . Congestive heart failure, unspecified   . Irritable bowel syndrome   . Edema      Medications: Reviewed [facility]   Physical Exam: Physical Exam    Constitutional: She appears well-developed and well-nourished.  HENT:  Head: Normocephalic and atraumatic.  Mouth/Throat: No oropharyngeal exudate.  Eyes: Conjunctivae and EOM are normal. Pupils are equal, round, and reactive to light. Right eye exhibits no discharge. Left eye exhibits no discharge. No scleral icterus.  Neck: Normal range of motion. Neck supple. No JVD present. No thyromegaly present.  Cardiovascular: Normal rate and regular rhythm.   Murmur heard.  Systolic murmur is present with a grade of 3/6  Pulmonary/Chest: Effort normal and breath sounds normal. No respiratory distress. She has no wheezes. She has no rales.  Abdominal: Soft. Bowel sounds are normal. She exhibits no distension. There is no tenderness. There is no rebound.  Musculoskeletal: Normal range of motion. She exhibits no edema and no tenderness.  Lymphadenopathy:    She has no cervical adenopathy.  Neurological: She is alert. She has normal reflexes. No cranial nerve deficit. She exhibits normal muscle tone. Coordination normal.  Skin: Skin is warm and dry. No rash noted. No erythema.  Yellow, thickened, brittle toe nails.   Psychiatric: Her mood appears anxious. Her affect is inappropriate. Her affect is not angry. Her speech is not rapid and/or pressured, not delayed, not tangential and not slurred. She is agitated, hyperactive and slowed. She is not aggressive, not withdrawn, not actively hallucinating and not combative. Thought content is not paranoid and not delusional. Cognition and memory are impaired. She expresses impulsivity and inappropriate judgment. She exhibits a depressed mood. She exhibits abnormal recent memory and abnormal remote memory.  Yelling out.     Filed Vitals:   02/26/13 2013  BP: 120/70  Pulse: 64  Temp: 98.7 F (37.1 C)  TempSrc: Tympanic  Resp: 22     Assessment/Plan Depressive disorder, not elsewhere classified  saw Psych Dr.Carr-Psychiatrist in the past. Off Cymbalta  60mg , sometimes the patient sleeps through verbal/physical stimuli--stable on Depakote 125mg  po bid daily-failed gradual dose reduction: yelling out more frequently since Depakote was reduced to 125mg  daily--will resumed Depakote 125mg  bid. Observe the patient.                  Unspecified hypothyroidism The hypothyroidism is stable. TSH 3.547 01/28/11 Synthroid          Unspecified essential hypertension blood pressure readings taken outside the office since the last visit have been in the target range.on Losartan 100mg  qd and Clonidine 0.1mg  qd                          Osteoarthritis Managed with Tylenol 500mg  qid.         Essential and other specified forms of tremor Intentional tremor and spastic all in all limbs,  on Propranolol 40mg  tid and Primidone 50mg  qid  Congestive heart failure, unspecified  acute on chronic diastolic heart failure, EF 45-50%, improved on Diuretic--Furosemide 60mg , no BLE edema,  moist rales diffused                Family/ Staff Communication: Observe the patient.   Goals of Care: SNF  Labs/tests ordered: none

## 2013-02-26 NOTE — Assessment & Plan Note (Signed)
The hypothyroidism is stable. TSH 3.547 01/28/11 Synthroid 

## 2013-02-26 NOTE — Assessment & Plan Note (Signed)
acute on chronic diastolic heart failure, EF 45-50%, improved on Diuretic--Furosemide 60mg , no BLE edema,  moist rales diffused

## 2013-02-26 NOTE — Assessment & Plan Note (Signed)
blood pressure readings taken outside the office since the last visit have been in the target range.on Losartan 100mg qd and Clonidine 0.1mg qd        

## 2013-02-26 NOTE — Assessment & Plan Note (Signed)
Intentional tremor and spastic all in all limbs,  on Propranolol 40mg  tid and Primidone 50mg  qid

## 2013-03-26 ENCOUNTER — Telehealth: Payer: Self-pay

## 2013-03-26 NOTE — Telephone Encounter (Signed)
Patient past away @ [facility] per [facility] in [employer] °

## 2013-04-08 DEATH — deceased

## 2015-02-16 NOTE — Progress Notes (Signed)
This encounter was created in error - please disregard.
